# Patient Record
Sex: Male | Born: 1940 | Race: White | Hispanic: No | Marital: Married | Smoking: Former smoker
Health system: Southern US, Community
[De-identification: ages and names within clinical notes are randomized; demographics above are authoritative.]

## PROBLEM LIST (undated history)

## (undated) DIAGNOSIS — I639 Cerebral infarction, unspecified: Secondary | ICD-10-CM

## (undated) DIAGNOSIS — R972 Elevated prostate specific antigen [PSA]: Secondary | ICD-10-CM

## (undated) DIAGNOSIS — I714 Abdominal aortic aneurysm, without rupture: Secondary | ICD-10-CM

## (undated) DIAGNOSIS — I10 Essential (primary) hypertension: Secondary | ICD-10-CM

## (undated) DIAGNOSIS — I2510 Atherosclerotic heart disease of native coronary artery without angina pectoris: Secondary | ICD-10-CM

## (undated) DIAGNOSIS — K439 Ventral hernia without obstruction or gangrene: Secondary | ICD-10-CM

## (undated) DIAGNOSIS — I701 Atherosclerosis of renal artery: Secondary | ICD-10-CM

## (undated) DIAGNOSIS — F0150 Vascular dementia without behavioral disturbance: Secondary | ICD-10-CM

## (undated) DIAGNOSIS — I619 Nontraumatic intracerebral hemorrhage, unspecified: Secondary | ICD-10-CM

## (undated) DIAGNOSIS — E785 Hyperlipidemia, unspecified: Secondary | ICD-10-CM

## (undated) HISTORY — DX: Atherosclerotic heart disease of native coronary artery without angina pectoris: I25.10

## (undated) HISTORY — DX: Essential (primary) hypertension: I10

## (undated) HISTORY — DX: Elevated prostate specific antigen (PSA): R97.20

## (undated) HISTORY — DX: Cerebral infarction, unspecified: I63.9

## (undated) HISTORY — DX: Atherosclerosis of renal artery: I70.1

## (undated) HISTORY — PX: CARDIAC CATHETERIZATION: SHX172

## (undated) HISTORY — DX: Nontraumatic intracerebral hemorrhage, unspecified: I61.9

## (undated) HISTORY — DX: Ventral hernia without obstruction or gangrene: K43.9

## (undated) HISTORY — DX: Abdominal aortic aneurysm, without rupture: I71.4

## (undated) HISTORY — DX: Hyperlipidemia, unspecified: E78.5

---

## 1997-05-23 DIAGNOSIS — I619 Nontraumatic intracerebral hemorrhage, unspecified: Secondary | ICD-10-CM

## 1997-05-23 HISTORY — DX: Nontraumatic intracerebral hemorrhage, unspecified: I61.9

## 1997-10-14 ENCOUNTER — Encounter: Admission: RE | Admit: 1997-10-14 | Discharge: 1998-01-12 | Payer: Self-pay

## 1997-12-10 ENCOUNTER — Encounter: Admission: RE | Admit: 1997-12-10 | Discharge: 1998-03-10 | Payer: Self-pay

## 2007-05-24 DIAGNOSIS — I2510 Atherosclerotic heart disease of native coronary artery without angina pectoris: Secondary | ICD-10-CM

## 2007-05-24 HISTORY — DX: Atherosclerotic heart disease of native coronary artery without angina pectoris: I25.10

## 2007-10-31 ENCOUNTER — Ambulatory Visit: Payer: Self-pay | Admitting: Cardiovascular Disease

## 2007-10-31 ENCOUNTER — Inpatient Hospital Stay (HOSPITAL_COMMUNITY): Admission: EM | Admit: 2007-10-31 | Discharge: 2007-11-06 | Payer: Self-pay | Admitting: Emergency Medicine

## 2007-10-31 ENCOUNTER — Encounter (INDEPENDENT_AMBULATORY_CARE_PROVIDER_SITE_OTHER): Payer: Self-pay | Admitting: Emergency Medicine

## 2007-10-31 ENCOUNTER — Encounter: Payer: Self-pay | Admitting: Emergency Medicine

## 2007-11-01 ENCOUNTER — Ambulatory Visit: Payer: Self-pay | Admitting: Thoracic Surgery (Cardiothoracic Vascular Surgery)

## 2007-11-02 ENCOUNTER — Encounter: Payer: Self-pay | Admitting: Surgery

## 2007-11-02 HISTORY — PX: CORONARY ARTERY BYPASS GRAFT: SHX141

## 2007-11-27 ENCOUNTER — Ambulatory Visit: Payer: Self-pay | Admitting: Surgery

## 2007-11-27 ENCOUNTER — Encounter: Admission: RE | Admit: 2007-11-27 | Discharge: 2007-11-27 | Payer: Self-pay | Admitting: Surgery

## 2007-12-17 ENCOUNTER — Ambulatory Visit: Payer: Self-pay | Admitting: Cardiology

## 2008-02-07 ENCOUNTER — Ambulatory Visit: Payer: Self-pay | Admitting: *Deleted

## 2008-03-06 ENCOUNTER — Encounter: Admission: RE | Admit: 2008-03-06 | Discharge: 2008-03-06 | Payer: Self-pay | Admitting: *Deleted

## 2008-03-13 ENCOUNTER — Ambulatory Visit: Payer: Self-pay | Admitting: *Deleted

## 2008-03-26 ENCOUNTER — Ambulatory Visit: Payer: Self-pay | Admitting: *Deleted

## 2008-03-26 ENCOUNTER — Ambulatory Visit (HOSPITAL_COMMUNITY): Admission: RE | Admit: 2008-03-26 | Discharge: 2008-03-26 | Payer: Self-pay | Admitting: *Deleted

## 2008-04-10 ENCOUNTER — Ambulatory Visit: Payer: Self-pay | Admitting: *Deleted

## 2008-05-06 ENCOUNTER — Inpatient Hospital Stay (HOSPITAL_COMMUNITY): Admission: RE | Admit: 2008-05-06 | Discharge: 2008-05-11 | Payer: Self-pay | Admitting: *Deleted

## 2008-05-06 ENCOUNTER — Encounter (INDEPENDENT_AMBULATORY_CARE_PROVIDER_SITE_OTHER): Payer: Self-pay | Admitting: *Deleted

## 2008-05-06 ENCOUNTER — Ambulatory Visit: Payer: Self-pay | Admitting: *Deleted

## 2008-05-06 DIAGNOSIS — I714 Abdominal aortic aneurysm, without rupture, unspecified: Secondary | ICD-10-CM

## 2008-05-06 HISTORY — DX: Abdominal aortic aneurysm, without rupture: I71.4

## 2008-05-06 HISTORY — DX: Abdominal aortic aneurysm, without rupture, unspecified: I71.40

## 2008-05-21 ENCOUNTER — Ambulatory Visit: Payer: Self-pay | Admitting: *Deleted

## 2008-05-29 ENCOUNTER — Ambulatory Visit: Payer: Self-pay | Admitting: *Deleted

## 2008-07-22 ENCOUNTER — Ambulatory Visit: Payer: Self-pay | Admitting: Cardiology

## 2008-08-28 ENCOUNTER — Ambulatory Visit: Payer: Self-pay | Admitting: *Deleted

## 2009-02-12 ENCOUNTER — Encounter: Payer: Self-pay | Admitting: Cardiology

## 2009-02-12 LAB — CONVERTED CEMR LAB
ALT: 11 units/L (ref 0–53)
Albumin: 4.1 g/dL (ref 3.5–5.2)
Calcium: 9.2 mg/dL (ref 8.4–10.5)
Chloride: 105 meq/L (ref 96–112)
Cholesterol: 118 mg/dL (ref 0–200)
HCT: 45 % (ref 39.0–52.0)
Hemoglobin: 14.6 g/dL (ref 13.0–17.0)
LDL Cholesterol: 61 mg/dL (ref 0–99)
Lymphocytes Relative: 17 % (ref 12–46)
Lymphs Abs: 1.4 10*3/uL (ref 0.7–4.0)
MCV: 88.1 fL (ref 78.0–100.0)
Neutro Abs: 5.8 10*3/uL (ref 1.7–7.7)
Neutrophils Relative %: 69 % (ref 43–77)
Platelets: 188 10*3/uL (ref 150–400)
Potassium: 4.5 meq/L (ref 3.5–5.3)
RBC: 5.11 M/uL (ref 4.22–5.81)
RDW: 15.2 % (ref 11.5–15.5)
Sodium: 143 meq/L (ref 135–145)
Total Protein: 6.3 g/dL (ref 6.0–8.3)
VLDL: 22 mg/dL (ref 0–40)
WBC: 8.5 10*3/uL (ref 4.0–10.5)

## 2009-07-23 ENCOUNTER — Emergency Department (HOSPITAL_COMMUNITY): Admission: EM | Admit: 2009-07-23 | Discharge: 2009-07-23 | Payer: Self-pay | Admitting: Emergency Medicine

## 2009-08-31 ENCOUNTER — Ambulatory Visit: Payer: Self-pay | Admitting: Surgery

## 2009-09-07 ENCOUNTER — Ambulatory Visit: Payer: Self-pay | Admitting: Surgery

## 2009-10-06 ENCOUNTER — Encounter (INDEPENDENT_AMBULATORY_CARE_PROVIDER_SITE_OTHER): Payer: Self-pay | Admitting: *Deleted

## 2010-02-24 ENCOUNTER — Encounter (INDEPENDENT_AMBULATORY_CARE_PROVIDER_SITE_OTHER): Payer: Self-pay | Admitting: *Deleted

## 2010-06-22 NOTE — Letter (Signed)
Summary: Appointment - Reminder 2  [facility] [place]. [address] [place] [address]   Phone: [phone]  Fax: [phone]     Oct 06, 2009 MRN: [id number]   [facility] [address] [place], [place]  [address]   Dear Mr. Mcbrearty,  Our records indicate that it is time to schedule a follow-up appointment.  Dr.   Dietrich Pates       recommended that you follow up with [place] in     07/2009       . It is very important that we reach you to schedule this appointment. We look forward to participating in your health care needs. Please contact us at the number listed above at your earliest convenience to schedule your appointment.  If you are unable to make an appointment at this time, give [place] a call so we can update our records.     Sincerely,   Glass blower/designer

## 2010-06-22 NOTE — Letter (Signed)
Summary: Appointment - Reminder 2  [facility] [place]. [address], [place] [address]   Phone: [phone]  Fax: [phone]     February 24, 2010 MRN: [id number]   Henry Allen [address] [place], [place]  [address]   Dear [name],  Our records indicate that it is time to schedule a follow-up appointment.  Dr. Dietrich Pates         recommended that you follow up with [place] in    07/2009        . It is very important that we reach you to schedule this appointment. We look forward to participating in your health care needs. Please contact us at the number listed above at your earliest convenience to schedule your appointment.  If you are unable to make an appointment at this time, give [place] a call so we can update our records.     Sincerely,   Glass blower/designer

## 2010-08-15 LAB — URINALYSIS, ROUTINE W REFLEX MICROSCOPIC
Glucose, UA: NEGATIVE mg/dL
Hgb urine dipstick: NEGATIVE
Ketones, ur: NEGATIVE mg/dL
Leukocytes, UA: NEGATIVE
Nitrite: NEGATIVE
Protein, ur: 30 mg/dL — AB
Urobilinogen, UA: 0.2 mg/dL (ref 0.0–1.0)
pH: 6 (ref 5.0–8.0)

## 2010-08-15 LAB — URINE MICROSCOPIC-ADD ON

## 2010-08-23 ENCOUNTER — Other Ambulatory Visit: Payer: Self-pay

## 2010-10-05 NOTE — Assessment & Plan Note (Signed)
OFFICE VISIT   Henry Allen, Henry Allen  DOB:  [birth year]                                       03/13/2008  ZOXWR#:[id number]   The patient returned to the office today with a CTA of the abdomen.  This does verify a 7.5 cm infrarenal abdominal aortic aneurysm.  The  infrarenal neck, however, does have significant atheromatous change  along with an area of laminated thrombus.  There are two left renal  arteries, the superior most may have a significant stenosis.  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  The iliac arteries are dilated  bilaterally, measuring 2.2 cm in the common iliac arteries.   Also, incidental note of gallstones.   With these findings, I do not feel that the patient is an ideal  candidate for placement of an aortic stent graft.  I do think he should  undergo a formal arteriogram to further delineate the anatomy of his  renal arteries and outline the proximal extent of the aneurysm in more  detail.  He is agreeable to this and this will be scheduled for  03/26/2008 at [facility].   Balinda Quails, M.D.  Electronically Signed   PGH/MEDQ  D:  03/13/2008  T:  03/14/2008  Job:  [id number]   cc:   Bevelyn Buckles. Bensimhon, MD  Evelene Croon, M.D.  Madelin Rear. Sherwood Gambler, MD

## 2010-10-05 NOTE — Procedures (Signed)
DUPLEX ULTRASOUND OF ABDOMINAL AORTA   INDICATION:  Follow-up evaluation of known abdominal aortic aneurysm.   HISTORY:  Diabetes:  No.  Cardiac:  Coronary artery bypass graft x4 on 11/02/07.  Hypertension:  Yes.  Smoking:  Patient recently quit.  Connective Tissue Disorder:  Family History:  Yes.  Previous Surgery:  Recent coronary artery bypass graft.   DUPLEX EXAM:         AP (cm)                   TRANSVERSE (cm)  Proximal             3.4 Cm                    3.5 cm  Mid                  6.0 cm                    5.2 cm  Distal               5.1 cm                    4.8 cm  Right Iliac          1.3 cm                    1.38 cm  Left Iliac           1.6 cm                    2.0 cm   PREVIOUS:  Date:  AP:  TRANSVERSE:   IMPRESSION:  1. Large abdominal aortic aneurysm is identified.  2. Bilateral common iliac artery dilation is noted.   ___________________________________________  P. Liliane Bade, M.D.   MC/MEDQ  D:  02/07/2008  T:  02/07/2008  Job:  [id number]

## 2010-10-05 NOTE — Discharge Summary (Signed)
[name], [name]               ACCOUNT NO.:  [id number]   MEDICAL RECORD NO.:  [id number]          PATIENT TYPE:  INP   LOCATION:  2025                         FACILITY:  [facility]   PHYSICIAN:  Balinda Quails, M.D.    DATE OF BIRTH:  1940-11-06   DATE OF ADMISSION:  05/06/2008  DATE OF DISCHARGE:  05/11/2008                               DISCHARGE SUMMARY   FINAL DISCHARGE DIAGNOSES:  1. 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  2. Dyslipidemia.  3. Hypertension  4. History of cerebrovascular disease.  5. History of atrial fibrillation.   PROCEDURE PERFORMED:  Abdominal aortic aneurysm repair using a 20 x 10  bifurcated Hemashield graft by Dr. Madilyn Fireman on May 06, 2008.   COMPLICATIONS:  None.   CONDITION AT DISCHARGE:  Stable and improving.   DISCHARGE MEDICATIONS:  1. Lisinopril 20 mg [name]. b.i.d.  2. Hydrochlorothiazide 25 mg [name]. daily.  3. Aspirin 81 mg [name]. daily.  4. Simvastatin 40 mg [name]. daily.  5. Amlodipine 10 mg [name]. daily.  6. Metoprolol 25 mg [name]. b.i.d.  7. He is given a prescription for Darvocet-N 100 one [name]. q.4 h.      [name]  8. He is instructed not to resume his labetalol at this time as his      blood pressure remains in the 120 systolic.  This may need to be      resumed at a later date.   DISPOSITION:  He is being discharged home in stable condition with his  wounds healing well.  He is given careful instructions regarding the  care of his wounds.  He is given an appointment to see Dr. Madilyn Fireman in 2  weeks for followup ABIs and staple removal.   Brief identifying statement complete details, please refer the typed  history and physical.  Briefly, this very pleasant 70 year old gentleman  was referred to Dr. Madilyn Fireman with a 7-cm abdominal aortic aneurysm with  bilateral iliac aneurysms.  Dr. Madilyn Fireman evaluated him found him to be a  suitable candidate for repair.  He was  informed of the risks and  benefits of the procedure and after careful consideration, he elected to  proceed with surgery.   HOSPITAL COURSE:  Preoperative workup was completed as an outpatient.  He was brought in through same-day surgery and underwent the  aforementioned repair of his abdominal aortic aneurysm.  For complete  details, please refer the typed operative report.  The procedure was  without complications.  He was returned to the intensive care unit in  critical but hemodynamically stable condition.  Mechanical ventilation  was subsequently weaned.  He was extubated.  He was able to be  transferred to a bed on a surgical convalescent floor in the second  postoperative day.  His diet and activity level were advanced as tolerated.  On May 11, 2008, he was walking and improving with physical therapy.  His wounds  were healing well.  His diet had been advanced to his preoperative diet.  He was desirous of discharge and was discharged home in stable  condition.      Henry Arms, PA      Henry Allen, M.D.  Electronically Signed    KEL/MEDQ  D:  05/11/2008  T:  05/11/2008  Job:  [id number]

## 2010-10-05 NOTE — Assessment & Plan Note (Signed)
OFFICE VISIT   RAYLAND, HAMED  DOB:  1941-01-03                                       05/29/2008  ZOXWR#:[id number]   The patient underwent abdominal aortic aneurysm repair 05/06/2008 at  [facility].  He is continuing to recover adequately.  He has  very typical fatigue and early satiety.   Blood pressure 145/76, pulse 60 per minute.  Midline incision healing  well.  No evidence of hernia.  Intact femoral pulses bilaterally.   The patient reassured.  He is recovering well from his surgery.  We will  continue followup in 4-6 weeks.   Balinda Quails, M.D.  Electronically Signed   PGH/MEDQ  D:  05/29/2008  T:  05/30/2008  Job:  [id number]   cc:   Evelene Croon, M.D.  Bevelyn Buckles. Bensimhon, MD  Madelin Rear. Sherwood Gambler, MD

## 2010-10-05 NOTE — Op Note (Signed)
Henry Allen, Henry Allen               ACCOUNT NO.:  [id number]   MEDICAL RECORD NO.:  [id number]          PATIENT TYPE:  INP   LOCATION:  2301                         FACILITY:  [facility]   PHYSICIAN:  Balinda Quails, M.D.    DATE OF BIRTH:  04-08-1941   DATE OF PROCEDURE:  05/06/2008  DATE OF DISCHARGE:                               OPERATIVE REPORT   SURGEON:  Balinda Quails, MD   ASSISTANT:  Jerold Coombe, PA   ANESTHETIC:  General endotracheal.   ANESTHESIOLOGIST:  Kaylyn Layer. Michelle Piper, MD   PREOPERATIVE DIAGNOSES:  1. A 7-cm infrarenal abdominal aortic aneurysm.  2. Bilateral common iliac artery aneurysms.   POSTOPERATIVE DIAGNOSES:  1. A 7-cm infrarenal abdominal aortic aneurysm.  2. Bilateral common iliac artery aneurysms.   PROCEDURE:  Repair of infrarenal abdominal aortic aneurysm and bilateral  common iliac artery aneurysms with aortobi-iliac graft.   CLINICAL NOTE:  Henry Allen is a 70 year old gentleman with history of  coronary artery disease, status post coronary artery bypass carried out  this year.  At that time, he was noted to have a large abdominal aortic  aneurysm.  He recovered well from his coronary artery surgery.  Workup  for his aneurysm revealed this to be a 7-cm juxtarenal abdominal aortic  aneurysm.  Extensive atherosclerotic change noted at the neck.  The  patient was not felt to be candidate for stent placement.  Also, he has  a left renal artery stenosis and bilateral common iliac artery  enlargement.  He is brought to the operating room at this time for  elective repair.   Potential risks of the operative procedure were reviewed with the  patient.  These include but are not limited to MI, CVA, renal failure,  limb loss, infection, and death.   OPERATIVE PROCEDURE:  The patient was brought to the operating room in  stable hemodynamic condition.  Foley catheter, arterial line, and Swan-  Ganz catheter in place.  In the supine position, general  endotracheal  anesthesia was induced.  The abdomen and both legs were prepped and  draped in a sterile fashion.   A midline skin incision was made from xiphoid to pubis.  The  subcutaneous tissue was divided with electrocautery.  The dissection was  carried down through the linea alba.  The peritoneal cavity was entered  without difficulty.   Full laparotomy evaluation was carried out.  Liver, gallbladder, and  bile ducts were normal.  The stomach and duodenum were unremarkable.  Small bowel was normal.  Large bowel revealed diverticular changes in  the left colon, but no other abnormality.   The retroperitoneum verified a large juxtarenal abdominal aortic  aneurysm.  The common iliac arteries were enlarged bilaterally.   The small bowel was retracted to the right, the transverse colon was  brought superiorly.  The neck of the aneurysm was dissected out up to  the left renal vein.  There was inflammatory change and a fair amount of  oozing from the neck area.  There was marked inflammatory changes in the  wall of the aorta.  The left renal artery was quite stuck.  It was  identified, but quite sucked in with inflammatory change.  Therefore, a  left renal artery bypass was decided against despite the left renal  artery stenosis.   The neck was dissected out up to the origin of the renal arteries  bilaterally.  Adequately exposed for placement of a clamp.  Distal  dissection was then carried down along the aneurysm sac to the aortic  bifurcation.  The retroperitoneum was incised along the course of the  right common iliac artery.  This was dissected down to the origin of the  hypogastric and right external iliac arteries which were each encircled  with vessel loops.  The right common iliac artery revealed ectasia and  early aneurysmal change with moderate plaque.   The peritoneal reflection along the left colon was then incised and the  left retroperitoneal space was entered.  This  exposed a left common  iliac bifurcation.  Left ureter was reflected laterally.  The left  common iliac bifurcation was dissected out and the hypogastric and left  external iliac arteries were encircled with vessel loops.  This vessel  also revealed moderate plaque and early aneurysmal change.   The patient was administered 5000 units of heparin intravenously and 25  g of mannitol.   The infrarenal aorta was controlled with an aortic DeBakey clamp.  The  iliac vessels were controlled bilaterally with coarctation clamps.  A  longitudinal arteriotomy was made through the aneurysm sac.  A large  amount of laminated thrombus removed.  No significant backbleeding of  the lumbar vessels were present.  The aneurysm was opened up to the  proximal neck.  The neck was endarterectomized and atherosclerotic  debris was removed and washed out.  A 20 x 10 Hemashield bifurcated  graft was chosen.  The body of graft was anastomosed to the neck of the  aneurysm using running 3-0 Prolene suture in an end-to-end fashion.  At  the completion of the proximal, anastomosis was flushed.  A small amount  of bleeding present and required 1 single repair suture.  The graft was  then flushed and each end of the graft controlled with Fogarty clamp.   The left limb of the graft was tunneled behind the left mesocolon and  sigmoid mesocolon.  This was brought out to the left retroperitoneal  exposure.  The left common iliac artery was divided transversely just  proximal to its bifurcation.  The left limb of the graft was divided and  anastomosed end-to-end to left common iliac bifurcation using running 4-  0 Prolene suture.  Flushing was carried out.  Clamps were then removed.  Left leg was reperfused without difficulty.   The right limb of the graft was then brought down to the right common  iliac bifurcation.  The right common iliac artery was divided  transversely.  The right limb was divided and anastomosed  end-to-end to  the right common iliac bifurcation using running 4-0 Prolene suture.  At  the completion of the distal anastomosis, again flushing was carried out  and the right leg was reperfused.   The patient was administered 50 mg of protamine intravenously.  Adequate  hemostasis was obtained.  Sponges and instrument counts were correct.   The aneurysm sac was closed over the graft using running 2-0 Vicryl  suture.  The retroperitoneum was reapproximated with running 3-0 Vicryl  suture.  Sponges and instrument counts were correct.  The abdomen was  examined to assure there were no retained instruments or sponges.   Midline fascia was then closed with running #1 PDS suture.  Skin was  closed with staples.  Sterile dressing was applied.   No apparent intraoperative complications.  The patient was transferred  to the recovery room in stable condition.      Balinda Quails, M.D.  Electronically Signed     PGH/MEDQ  D:  05/06/2008  T:  05/06/2008  Job:  [id number]
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
## 2010-10-05 NOTE — Letter (Signed)
December 17, 2007    Madelin Rear. Sherwood Gambler, MD  [address]  [place], [place] [address]   RE:  Henry Allen, Henry Allen  MRN:  [id number]  /  DOB:  1940/08/25   Dear Peyton Najjar,   Mr. Schmader returns to the office following CABG surgery at [facility].  He presented with a subacute myocardial infarction and was  found to have pericarditis at the time of his operation.  He developed  atrial fibrillation postoperatively, but has done generally well.  He is  back to a fairly active lifestyle without cardiopulmonary symptoms.  He  has been seen in followup by Dr. Laneta Simmers, who indicated that no further  visits to his office would be necessary.  Hypertension is well  controlled.  Mr. Deeb continues to refrain from cigarette smoking.  His lipid profile has not been assessed since surgery, but was  relatively good preoperatively.  A substantial anemia was noted in the  postoperative interval.   Current medications include:  1. Lisinopril 40 mg daily.  2. Metoprolol 25 mg b.i.d.  3. Simvastatin 20 mg nightly.  4. Transdermal nicotine.   His discharge summary included aspirin 81 mg daily, but he is not taking  this.  He uses tramadol p.r.n.   PHYSICAL EXAMINATION:  GENERAL:  A pleasant proportionate gentleman in  no acute distress.  VITAL SIGNS:  The weight is 188, blood pressure 140/85, heart rate 70  and regular, and respirations 16.  NECK:  No jugular venous distention; no carotid bruits.  LUNGS:  Clear.  CARDIAC:  Normal first and second heart sounds; fourth heart sound  present.  THORAX:  Stable sternum without tenderness.  ABDOMEN:  Soft and nontender; no organomegaly.  EXTREMITIES:  No edema; well-healed incisions; distal pulses intact.   EKG:  Normal sinus rhythm; left atrial abnormality; LVH with  repolarization abnormality; delayed R-wave progression.   IMPRESSION:  Mr. Simien is doing generally well.  Additional lipid  lowering would be beneficial.  His dose of simvastatin will be  increased  to 40 mg daily.  He will start on aspirin as was intended.  He is  congratulated on not returning to cigarette smoking.  I will reassess  this nice gentleman in 6 months.    Sincerely,      Gerrit Friends. Dietrich Pates, MD, [facility]  Electronically Signed    RMR/MedQ  DD: 12/17/2007  DT: 12/18/2007  Job #: [id number]

## 2010-10-05 NOTE — Letter (Signed)
July 22, 2008    Henry Rear. Sherwood Gambler, MD  [address]  [place], [place] [address]   RE:  Henry Allen, Henry Allen  MRN:  [id number]  /  DOB:  03-25-1941   Dear Henry Allen,   Henry Allen returns to the office for continued assessment and treatment  of coronary artery disease, peripheral vascular disease, and multiple  cardiovascular risk factors.  Since his last visit in mid 2009, he has  undergone repair of his abdominal aortic aneurysm.  This occurred in  December of last year and was an open procedure.  He had a difficult to  recovery, but now feels that he is about back to baseline.  Blood  pressure control has been good.  Control of hyperlipidemia has been  good.  He continues to refrain from cigarette smoking for the most part.  He uses a nicotine replacement device.  When he has no refills for that,  he does smoke an occasional cigarette.  He has no chest discomfort nor  dyspnea.  Blood pressure control has been good.   CURRENT MEDICATIONS:  1. Lisinopril 40 mg b.i.d.  2. Metoprolol 25 mg b.i.d.  3. Simvastatin 40 mg daily.  4. Aspirin 81 mg daily.  5. Labetalol 200 mg b.i.d.  6. HCTZ 12.5 mg daily.  7. Amlodipine 10 mg daily.   PHYSICAL EXAMINATION:  GENERAL:  Pleasant gentleman in no acute  distress.  VITAL SIGNS:  The weight is 200, 12 pounds more than last year.  Blood  pressure is 120/70, heart rate is 65 and regular, respirations 12 and  unlabored.  NECK:  No jugular venous distention; normal carotid upstrokes without  bruits.  LUNGS:  Clear.  CARDIAC:  Normal first and second heart sounds; minimal systolic  ejection murmur.  ABDOMEN:  Soft and nontender; no bruits.  EXTREMITIES:  Distal pulses are intact; no edema.   Most recent lipid profile is from last August and was excellent with a  total cholesterol of 141, triglycerides of 65, HDL of 59, and LDL of 69.  Chemistry profile and CBC were normal at that time.   IMPRESSION:  Henry Allen is doing very well with his current  medical  therapy.  He has some financial issues regarding his medical care.  He  is exploring resources to help him with his low-cost generic drugs and  with his other medical expenses.  I will check a chemistry profile, CBC,  and lipid profile in 6 months and plan to see this nice gentleman again  in 1 year.    Sincerely,      Gerrit Friends. Dietrich Pates, MD, [facility]  Electronically Signed    RMR/MedQ  DD: 07/22/2008  DT: 07/23/2008  Job #: [id number]

## 2010-10-05 NOTE — Procedures (Signed)
RENAL ARTERY DUPLEX EVALUATION   INDICATION:  50% left renal artery stenosis by catheterization on  11/01/07.  BUN 30, creatinine 1.21.  Known abdominal aortic aneurysm and  enlarged right kidney by CT, performed 10/31/07.   HISTORY:  Diabetes:  No.  Cardiac:  Coronary artery bypass graft x4 on 11/02/07.  Hypertension:  Yes.  Smoking:  Recently quit.   RENAL ARTERY DUPLEX FINDINGS:  Aorta-Proximal:  106 cm/s  Aorta-Mid:  101 cm/s  Aorta-Distal:  44 cm/s  Celiac Artery Origin:  SMA Origin:  227 cm/s                                    RIGHT               LEFT  Renal Artery Origin:             136/21 cm/s         Not imaged  Renal Artery Proximal:           70/16 cm/s          222/43 cm/s  Renal Artery Mid:                72/14 cm/s          192/29 cm/s  Renal Artery Distal:             94/15 cm/s          58/10 cm/s  Hilar Acceleration Time (AT):  Renal-Aortic Ratio (RAR):        1.28                2.01  Kidney Size:                     13.2 cm             10.6 cm  End Diastolic Ratio (EDR):       0.26 - 0.34         0.32 - 0.33  Resistive Index (RI):            0.78                0.81   IMPRESSION:  1. The right kidney is larger than the left kidney.  2. Resistive indices suggest parenchymal disease bilaterally.  3. Left renal artery velocities are elevated; however, the renal-to-      aortic ratio is invalid secondary to known abdominal aortic      aneurysm.   ___________________________________________  P. Liliane Bade, M.D.   MC/MEDQ  D:  02/07/2008  T:  02/07/2008  Job:  [id number]

## 2010-10-05 NOTE — Procedures (Signed)
RENAL ARTERY DUPLEX EVALUATION   INDICATION:  Follow up known left renal artery disease.   HISTORY:  Diabetes:  No.  Cardiac:  CABG x4 on 11/02/07.  Hypertension:  Yes.  Smoking:  Yes.   RENAL ARTERY DUPLEX FINDINGS:  Aorta-Proximal:  38 cm/s  Aorta-Mid:  40 cm/s  Aorta-Distal:  14 cm/s  Celiac Artery Origin:  Not obtained  SMA Origin:  82 cm/s                                    RIGHT               LEFT  Renal Artery Origin:             Not imaged          220/44 cm/s  Renal Artery Proximal:           43/10 cm/s          249/73 cm/s  Renal Artery Mid:                99/13 cm/s          288/64 cm/s  Renal Artery Distal:             102/12 cm/s         115/18 cm/s  Hilar Acceleration Time (AT):    40/10 m/s2          38/13 m/s2  Renal-Aortic Ratio (RAR):        Not accurate due to AAA repair         Not accurate due to AAA  repair  Kidney Size:                     14.9 cm             12.8 cm  End Diastolic Ratio (EDR):  Resistive Index (RI):            0.72                0.65   IMPRESSION:  1. Right renal artery appears to have normal velocities.  2. Left renal artery possibly suggests >60% stenosis; however, the      renal-aortic ratio is invalid secondary to abdominal aortic      aneurysm repair.  3. The left kidney is slightly smaller than the right.        ___________________________________________  V. Charlena Cross, MD   NT/MEDQ  D:  08/31/2009  T:  09/01/2009  Job:  [id number]

## 2010-10-05 NOTE — H&P (Signed)
Henry Allen, Henry Allen               ACCOUNT NO.:  [id number]   MEDICAL RECORD NO.:  [id number]          PATIENT TYPE:  INP   LOCATION:  2899                         FACILITY:  [facility]   PHYSICIAN:  Balinda Quails, M.D.    DATE OF BIRTH:  [birth year]   DATE OF ADMISSION:  05/06/2008  DATE OF DISCHARGE:                              HISTORY & PHYSICAL   PRIMARY CARE PHYSICIAN:  Henry Rear. Sherwood Gambler, MD   CARDIOLOGIST:  Bevelyn Buckles. Bensimhon, MD   THORACIC SURGEON:  Evelene Croon, MD   ADMISSION DIAGNOSIS:  Abdominal aortic aneurysm.   HISTORY:  Mr. Henry Allen is a 70 year old gentleman who underwent  coronary artery bypass in June of this year by Dr. Laneta Simmers.  He has a  history of poorly-controlled hypertension and was referred for  management of the abdominal aortic aneurysm.  Ultrasound revealed his  aneurysm to be 6 cm in diameter.  Common iliac arteries noted to be  dilated.  Renal duplex revealed a small left kidney with elevated  velocities consistent with possible left renal artery stenosis.   He has undergone CT angiogram and arteriography.  This does verify  severe stenosis in the left renal artery, bilateral iliac ectasia,  atherosclerotic infrarenal aortic neck, and laminated thrombus present.   The patient scheduled at this time to undergo abdominal aortic aneurysm  repair.   PAST MEDICAL HISTORY:  1. Coronary artery disease, status post coronary artery bypass.  2. Atrial fibrillation.  3. Hypertension.  4. CVA.  5. Hyperlipidemia.   MEDICATIONS:  1. Lisinopril 20 mg daily.  2. Hydrochlorothiazide 25 mg daily.  3. Aspirin 81 mg daily.  4. Simvastatin 40 mg daily.  5. Amlodipine 10 mg daily.  6. Metoprolol 25 mg b.i.d.  7. Labetalol 200 mg b.i.d.   ALLERGIES:  None known.   FAMILY HISTORY:  Mother had a history of abdominal aortic aneurysm.  Father had coronary artery bypass and hypertension.   SOCIAL HISTORY:  The patient is married and lives in [place].  He  has  3 children who are grown.  He is retired.  Discontinued tobacco use in  June of this year.  Smoked up to 120-pack a year.  No regular alcohol  use.   REVIEW OF SYSTEMS:  The patient denies any recent chest pain or  shortness of breath.  No cough or sputum production.  No recent weight  loss.  No change in bowel habits.   PHYSICAL EXAMINATION:  GENERAL:  A 70 year old male alert and oriented.  No acute distress.  VITAL SIGNS:  BP 150/60, pulse 54 per minute, respirations 16 per  minute.  HEENT:  Mouth and throat are clear.  Normocephalic.  NECK:  Supple.  No thyromegaly or adenopathy.  CARDIOVASCULAR:  Normal heart sounds.  No murmurs.  No gallops or rubs.  Regular rate and rhythm.  No carotid bruits.  CHEST:  Equal air entry bilaterally.  No rales or rhonchi.  ABDOMEN:  Soft, nontender.  AAA palpable.  No organomegaly or other  masses felt.  Normal bowel sounds.  EXTREMITIES:  Femoral, popliteal, posterior tibial, and  dorsalis pedis  pulses are 2+ bilaterally.  No ankle edema.  NEUROLOGIC:  Cranial nerves intact.  Strength equal bilaterally.  SKIN:  Normal.   IMPRESSION:  1. A 6-cm infrarenal abdominal aortic aneurysm with bilateral common      iliac artery dilatation.  2. Left renal artery stenosis.  3. Hypertension.  4. Coronary artery disease.  5. Hyperlipidemia.   ADMISSION PLAN:  The patient admitted electively to [facility]  for AAA repair and possible left renal artery bypass.      Balinda Quails, M.D.  Electronically Signed     PGH/MEDQ  D:  05/06/2008  T:  05/06/2008  Job:  [id number]   cc:   Henry Rear. Sherwood Gambler, MD  Bevelyn Buckles. Bensimhon, MD  Evelene Croon, M.D.

## 2010-10-05 NOTE — Assessment & Plan Note (Signed)
OFFICE VISIT   Henry Allen, Henry Allen  DOB:  1940-10-14                                       05/21/2008  QIONG#:[id number]   The patient presented to the office today for removal of his staples.  He recently underwent repair of abdominal aortic aneurysm by Dr. Madilyn Fireman.  All the staples were removed today.  However, he does have a moderate  amount of erythema in the lower portion of the abdominal incision.  This  extends approximately a centimeter and a half away from the skin  incision itself.  There is no obvious drainage.  There is no hernia  defect.  He has 2+ femoral pulses.  The patient states he has taken  showers and washed the incision infrequently since his discharge from  the hospital.  I encouraged him to watch this daily with soap and water  and take at shower at least once daily.  I also started him on Keflex  today, a 10-day prescription was given.  He will follow up with Dr.  Madilyn Fireman at his regularly scheduled postop visit.  He will return sooner if  he develops any drainage, fevers or deterioration of his abdominal  wound.   Janetta Hora. Fields, MD  Electronically Signed   CEF/MEDQ  D:  05/21/2008  T:  05/22/2008  Job:  [id number]

## 2010-10-05 NOTE — Assessment & Plan Note (Signed)
OFFICE VISIT   Henry Allen, Henry Allen  DOB:  1940-06-24                                       09/07/2009  ZOXWR#:[id number]   The patient is a 70 year old gentleman, a former patient of Dr. Madilyn Fireman.  He is status post open repair of a 7-cm infrarenal abdominal aortic  aneurysm and bilateral common iliac aneurysms using a 20 x 10 bifurcated  graft.  This was done on May 06, 2008.  The patient has been doing  well since that time.  He does complain of abdominal wall hernias, which  he does not like.  He is not having any symptoms of obstruction or  incarceration/strangulation.  He has recently started smoking  approximately 2 packs per week.  He had quit for some time.   The patient continues to suffer from hypertension and  hypercholesterolemia, both of which are medically treated.  He remains  on his cardiac medications including aspirin.  He is not having any  chest pains.  He is status post CABG.   REVIEW OF SYSTEMS:  GENERAL:  Negative weight gain,negative for weight  loss.  CARDIAC:  Negative for chest pain.  PULMONARY:  Negative for shortness of breath.   All other review of systems is negative as documented in the encounter  form.   SOCIAL HISTORY:  He is married with 3 children, retired.  He has  recently started back smoking.  He now smokes 2 packs per week.  He had  quit in June 2009.  Does not drink alcohol.   PHYSICAL EXAMINATION:  Heart rate 55, blood pressure 124/68, O2  saturation 99%.  Generally, he is well-appearing, in no distress.  HEENT:  Within normal limits.  Lungs are clear bilaterally.  Cardiovascular is regular rate rhythm.  Abdomen is soft.  He does have 2  large abdominal wall ventral hernias, which are easily reducible.  Neurologic:  Cranial nerves II-XII are grossly intact.  Skin is without  rash.   DIAGNOSTIC STUDIES:  Renal artery duplex was performed today that shows  a possible greater than 50% stenosis.  However, the  renal/aorta ratio is  invalid secondary to his abdominal graft.  The left kidney is slightly  smaller than the right at 12.8 cm, the right is 14.9.  Resistive index  on the left is 0.65.  This is down from his previous resistive index in  September 2009, where it was 0.81.   ASSESSMENT/PLAN:  1. Abdominal aortic aneurysm:  The patient is stable status post      repair.  He has palpable posterior tibial pulses.  2. Ventral hernia:  I am making a referral with general surgery to      discuss the possibility of repair.  3. Renal artery stenosis:  We will continue to observe this for now.      His blood pressure is relatively well-controlled  I do not think      that he would receive any benefit from further evaluation at this      time.  I will repeat his renal ultrasound in 1 year.  4. History of stroke:  We will repeat his carotid ultrasound in 1      year.  I will see the patient back in 1 year please.     Jorge Ny, MD  Electronically Signed   VWB/MEDQ  D:  09/07/2009  T:  09/08/2009  Job:  [id number]   cc:   Evelene Croon, M.D.  Bevelyn Buckles. Bensimhon, MD  Madelin Rear. Sherwood Gambler, MD

## 2010-10-05 NOTE — Assessment & Plan Note (Signed)
OFFICE VISIT   Henry, Allen  DOB:  1940-12-16                                        November 27, 2007  CHART #:  [id number]   HISTORY:  The patient returned to my office today for followup status  post coronary artery bypass graft surgery x4 on 11/02/2007.  He had  presented with a 2-week history of stuttering chest pain and sustained  an out of hospital myocardial infarction.  He was noted to have markedly  inflamed and thickened pericardium from acute pericarditis at the time  of surgery and I removed as much of this as possible.  He was treated  with a steroid taper postoperatively for acute pericarditis.  He also  had postoperative atrial fibrillation and was converted with amiodarone  and sent home on oral dose.  Since discharge, he has been feeling  relatively well and is walking daily without chest pain or shortness of  breath.  He has no complaints today.  He has not seen Cardiology in  follow up yet.   PHYSICAL EXAMINATION:  VITAL SIGNS:  His blood pressure is 136/81, his  pulse is 92 and regular.  Respiratory rate is 20 and unlabored.  Oxygen  saturation on room air is 96%.  GENERAL:  He looks well.  CARDIAC:  Regular rate and rhythm with normal heart sounds.  LUNG:  Clear.  CHEST:  The chest incision is healing well and the sternum is stable.  EXTREMITIES:  His right leg vein harvest incisions are intact.  There  was a small subcutaneous hematoma or seroma just above the knee  incision, but no sign of infection.  There is moderate edema in the  right lower leg from the knee down.  There is no edema in the left leg.   IMAGING STUDIES:  Followup chest x-ray today shows left basilar  atelectasis.   MEDICATIONS:  1. Lopressor 25 mg b.i.d.  2. Amiodarone 200 mg b.i.d.  3. Zocor 20 mg daily.  4. Lisinopril 20 mg daily.  5. Iron 150 mg daily.  6. Ultram p.r.n. for pain.  I told him that he could discontinue the      amiodarone after his current  prescription is completed.   IMPRESSION:  Overall, the patient is making a good recovery following  surgery.  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  He will follow up with Dr. Gala Romney  in [place] on 12/17/2007.  He is also going to follow up with his  primary physician, Dr. Sherwood Gambler.  He will return to see me if he develops  any problems with his incisions.   Evelene Croon, M.D.  Electronically Signed   BB/MEDQ  D:  11/27/2007  T:  11/27/2007  Job:  [id number]   cc:   Bevelyn Buckles. Bensimhon, MD  Madelin Rear. Sherwood Gambler, MD

## 2010-10-05 NOTE — Assessment & Plan Note (Signed)
OFFICE VISIT   ANTOINE, VANDERMEULEN  DOB:  07/23/1940                                       08/28/2008  ZOXWR#:[id number]   The patient underwent abdominal aortic aneurysm repair along with  bilateral common iliac aneurysm repair carried out May 06, 2008, at  [facility].  He has continued to recover well since that time.  His symptoms of fatigue and early satiety have now subsided.  He has no  abdominal pain.  No major complaints.   His blood pressure is 128/69, pulse is 60 per minute.  His abdomen is  soft and nontender.  Midline incision well-healed.  No evidence of  hernia.  Femoral pulses 2+ bilaterally.   CURRENT MEDICATIONS:  1. Lisinopril 20 mg 2 times daily.  2. Hydrochlorothiazide 25 mg daily.  3. Aspirin 81 mg daily.  4. Simvastatin 40 mg daily.  5. Amlodipine 10 mg daily.  6. Metoprolol 25 mg 2 times daily.   The patient recovered well from his aortic aneurysm surgery.  He does  have a known left renal artery stenosis.  I will plan to have him return  to the office for a renal duplex in 1 year.   Balinda Quails, M.D.  Electronically Signed   PGH/MEDQ  D:  08/28/2008  T:  08/29/2008  Job:  [id number]   cc:   Evelene Croon, M.D.  Bevelyn Buckles. Bensimhon, MD  Madelin Rear. Sherwood Gambler, MD

## 2010-10-05 NOTE — Op Note (Signed)
NAMEBEVERLEY, SHERRARD               ACCOUNT NO.:  [id number]   MEDICAL RECORD NO.:  [id number]          PATIENT TYPE:  INP   LOCATION:  2301                         FACILITY:  [facility]   PHYSICIAN:  Evelene Croon, M.D.     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   POSTOPERATIVE DIAGNOSIS:  Severe three-vessel coronary disease status  post inferior myocardial infarction.   OPERATIVE PROCEDURE:  Median sternotomy, extracorporeal circulation,  coronary bypass graft surgery x4 using a left internal mammary artery  graft to the left anterior descending coronary, with a saphenous vein  graft to the diagonal branch of the left anterior descending, saphenous  vein graft to the intermediate coronary artery, and a saphenous vein  graft to the acute marginal branch of the right coronary.  Endoscopic  vein harvesting from the right leg.   ATTENDING SURGEON:  Evelene Croon, MD   ASSISTANTS:  1. Salvatore Decent. Cornelius Moras, MD  2. Doree Fudge, PA   ANESTHESIA:  General endotracheal.   CLINICAL HISTORY:  This patient is a 70 year old gentleman who presented  with a 2-week history of stuttering chest pain and sustaining out of  hospital a myocardial infarction.  Cardiac catheterization showed severe  three-vessel coronary disease.  The patient had a high-grade stenosis in  a small-to-moderate sized first diagonal branch.  The LAD was occluded  beyond this with filling by collaterals from the right coronary artery.  The intermediate vessel was a large vessel with a high-grade proximal  stenosis.  The left circumflex gave off a long, thin, but  small  marginal branch and then also terminated as a small distal branch.  The  right coronary artery was also severely diseased with a high-grade  proximal stenosis.   There was a large acute marginal or hot posterior  descending branch.  There was a tiny branch where the posterior  descending would be expected.  There is also a moderate size  posterolateral branch.  Left ventricular function was mildly reduced  with inferior posterior akinesis.  After review of the angiogram and  examination of the patient, it was felt that coronary bypass graft  surgery is best treatment to prevent further ischemia and infarction.  I  discussed the operative procedure with the patient including  alternatives, benefits, and risks including, but not limited to  bleeding, blood transfusion, infection, stroke, myocardial infarction,  graft failure, and death.  He understood and agreed to proceed.   OPERATIVE PROCEDURE:  The patient was taken to the operating room, and  placed on table in supine position.  After induction of general  endotracheal anesthesia, a Foley catheter was placed in bladder using  sterile technique.  Then the chest, abdomen and both lower extremities  were prepped and draped in usual sterile manner.  A transesophageal  echocardiogram was performed by Anesthesiology.  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  There is no  mitral  regurgitation.  There is no aortic stenosis.  There was mild-to-  moderate aortic insufficiency, which was central.  The leaflets moved  well and appeared to coapt fairly well but there was small area of  central regurgitation.  This was noted on preoperative echo.  After  reviewing this, I felt that this was not bad enough to warrant aortic  valve replacement.  The ascending aorta was a fairly normal size.   Then the chest was opened through a median sternotomy incision.  Examination of pericardium showed that there was marked acute  inflammation and thickening of the parietal pericardium.  There appeared  to be acute pericarditis.   Then attention was turned to the left internal  mammary, which was  harvested as a pedicle graft.  This is a medium caliber vessel with  excellent blood flow through it.  At the same time, a segment of greater  saphenous vein was harvested from the right leg using endoscopic vein  harvest technique and this vein was a medium size and good quality.   Then, attention was returned to the pericardium.  It was opened in the  midline.  There was severe acute pericarditis with marked thickening of  the parietal and visceral pericardium.  This was completely encasing the  heart with obliteration of pericardial space.  The the pericardium was  very vascular and inflamed.  The heart was dissected from the  pericardium to expose the right atrium and ascending aorta.  There was a  very thick layer of inflamed visceral pericardium over the right atrium  and this was carefully removed.  Then, the patient was heparinized when  an adequate activated clotting time was achieved.  The distal ascending  aorta was cannulated using a 20-French aortic cannula for arterial  inflow.  Venous outflow was achieved using a two-stage venous cannula  for the right atrial appendage.  An antegrade cardioplegia and vent  cannula was inserted in aortic root.  A retrograde cardioplegic cannula  was inserted through the right atrium and the coronary sinus.   The patient was placed on cardiopulmonary bypass.  The remainder of the  heart was dissected from the pericardium.  This was very tedious and  took considerable amount of time due to the acute inflammation with very  dense adhesions.  The surface of the heart was so inflamed, it was  difficult to dissect out the coronary arteries.  This took a  considerable amount of time.  I was able to find the acute marginal  branch which was a large vessel that was graftable.  There was a tiny  posterior descending branch lying adjacent to a large posterior  descending vein which was not graftable.  The posterolateral branch of   the right coronary artery was not visible.  The inflammation in this  inferior wall was very severe and it was markedly thickened.  It was not  possible to find this vessel.  The intermediate vessel was a large  graftable vessel.  The obtuse marginal was fairly small and diffusely  diseased and not felt to be graftable especially with the degree of  inflammation.  The distal left circumflex was not graftable.  The  diagonal branch was small, but graftable.  The LAD was diffusely  diseased, but suitable for grafting.   Then the aorta was crossclamped and 500 mL antegrade cardioplegia was  administered in the aortic root with quick arrest of the heart.  This  was followed by 500 mL of  cold blood retrograde cardioplegia.  Systemic  hypothermia to 20 degrees centigrade and topical hypothermic iced saline  was used.  Temperature probe placed in septum insulating pad in the  pericardium.   The first distal anastomosis was then performed to the intermediate  coronary artery.  The internal diameter was about 2 mm.  Conduit used  was a segment of greater saphenous vein and the anastomosis performed in  a end-to-side manner using 10-0 and 7-0 Prolene suture.  Flow was noted  through the graft was excellent.   The second distal anastomosis was performed to diagonal branch.  The  internal diameter was about 1.5-1.6 mm.  The conduit used was a second  segment of greater saphenous vein.  The anastomosis performed in an end-  to-side manner using continuous 7-0 Prolene suture.  Flow was noted  through the graft was excellent.  Then another dose of cardioplegia was  given in a retrograde manner as well as down the grafts.   The third distal anastomosis was performed to the acute marginal branch.  The internal diameter was about 1.6 mm.  Conduit used was a third  segment of greater saphenous vein. The anastomosis performed in an end-  to-side manner using continuous 7-0 Prolene suture.  Flow was  noted  through the graft was excellent.   The fourth distal anastomosis was performed to the distal LAD.  The  internal diameter was about 1.75 mm.  Conduit used was a left internal  mammary graft and this was brought through an opening in the left  pericardium anterior to the phrenic nerve.  This was anastomosed to the  LAD in an end-to-side manner using continuous 8-0 Prolene suture.  The  pedicle was sutured to the epicardium with 6-0 Prolene sutures.  Then,  the patient was rewarmed to 37 degrees centigrade.  An additional dose  of cardioplegia was given.  Then the 3 proximal vein graft anastomoses  were performed in the aortic root in an end-to-side manner using  continuous 6-0 Prolene suture. Then the clamp was removed from mammary  pedicle.  There is rapid warming of the ventricle septum and return of  spontaneous ventricular fibrillation.  The crossclamp was removed at  time 133 minutes.  The patient was spontaneously converted to sinus  rhythm.  The proximal and distal anastomoses appeared hemostatic while  the graft was satisfactory. Graft markers placed around the proximal  anastomoses.  Two temporary right ventricular and atrial pacing wires  placed above the skin.   When the patient rewarmed to 37 degrees centigrade, he was weaned from  cardiopulmonary bypass on low-dose dopamine.  Total bypass time was 187  minutes.  There initially appeared to be some right ventricular  dysfunction, but this improved with time.  Transesophageal  echocardiogram showed no change in the aortic insufficiency which I  chose as mild-to-moderate.  Left ventricular function appeared well-  preserved.  There is no mitral regurgitation.  Cardiac output was 7  liters minute.  Then protamine was given and the venous and aortic  cannulae were removed without difficulty.  After protamine was given,  the patient was still not clotting and was diffusely bleeding from all  the wall surface inside the  chest.  The patient was given 1 unit of  platelets and 2 units of FFP, which still did not correct the  coagulopathy and the patient was still not clotting and bleeding  profusely.  I could not find any specific bleeding site that could  be  cauterized.  All the pericardium was oozing blood.  At this point, I  decided to give the patient Nova 7, which was ordered from the pharmacy  and given over 3-5 minutes.  This resulted in marked improved  coagulation.  The patient was also given some cryoprecipitate.  With  this hemostasis was achieved.  Three chest tubes were placed, 2 in  posterior pericardium, 1 in left pleural space, and 1 in the anterior  mediastinum.  The pericardium was not reapproximated over the heart and  was cut away anteriorly.  The sternum was closed with #6 stainless steel  wires.  The fascia was closed with continuous #1 Vicryl suture.  Subcutaneous tissue was closed with continuous 2-0 Vicryl and the skin  with 3-0 Vicryl subcuticular closure.  Lower extremity vein harvest site  was closed in layers in similar manner.  The sponge, needle, and  instrument counts were correct.  Dry sterile dressings were applied over  the incisions, around the chest tubes which were Pleur-Evac suction.  The patient remained hemodynamically stable, was transported to the  SICU, in guarded, but stable condition.      Evelene Croon, M.D.  Electronically Signed     BB/MEDQ  D:  11/03/2007  T:  11/03/2007  Job:  [id number]   cc:   [facility]  Cardiology

## 2010-10-05 NOTE — Consult Note (Signed)
VASCULAR SURGERY CONSULTATION   Henry Allen, Henry Allen  DOB:  17-Jul-1940                                       02/07/2008  ZOXWR#:[id number]   REFERRING PHYSICIAN:  Madelin Rear. Sherwood Gambler, MD.   CARDIOLOGIST:  Bevelyn Buckles. Bensimhon, MD.   THORACIC SURGEON:  Evelene Croon, MD.   REFERRAL DIAGNOSIS:  Abdominal aortic aneurysm.   HISTORY:  The patient is a 70 year old gentleman who underwent coronary  artery bypass on June 12 of this year by Dr. Laneta Simmers.  He has a history  of poorly controlled hypertension and was referred for abdominal  ultrasound and renal duplex scan today.  Results of this revealed a 6 cm  abdominal aortic aneurysm, the iliac arteries are noted to be dilated  bilaterally.  Renal duplex scan also reveals a small left kidney with  elevated velocities consistent with possible left renal artery stenosis.   The patient has been free of abdominal pain or back pain.   Risk factors for aneurysm disease include coronary artery disease, male  sex, tobacco abuse, hypertension and positive family history.   PAST MEDICAL HISTORY:  1. Coronary disease status post coronary artery bypass.  2. Atrial fibrillation.  3. Hypertension.  4. CVA.  5. Hyperlipidemia.   MEDICATIONS:  1. Simvastatin 40 mg q.h.s..  2. Labetalol 200 mg b.i.Allen.  3. Amlodipine 10 mg daily.  4. Metoprolol 25 mg b.i.Allen.  5. Aspirin 81 mg daily.  6. Lisinopril 20 mg t.i.Allen.  7. Hydrochlorothiazide 25 mg daily.   ALLERGIES:  None known.   FAMILY HISTORY:  Mother had a history of abdominal aortic aneurysm.  Father had coronary artery bypass and hypertension.   SOCIAL HISTORY:  The patient is married, lives in [place].  He has  three children who are grown.  He is retired.  Discontinued tobacco use  in June of this year, having a smoking history of a 120 pack years.  No  regular alcohol use.   REVIEW OF SYSTEMS:  Refer to patient encounter form.  This is negative  for recent changes.  He did  have some visual disturbance in his eye  prior to his heart surgery but this has not recurred.   PHYSICAL EXAMINATION:  General:  A 70 year old male alert and oriented.  No acute distress.  Vital signs:  BP is 157/66, pulse 54 per minute,  respirations are 16 per minute.  HEENT:  Mouth and throat are clear.  Normocephalic.  Neck:  Supple.  No thyromegaly or adenopathy.  Cardiac:  Normal heart sounds.  No murmurs.  No gallops or rubs.  Regular rate and  rhythm.  No carotid bruits.  Chest:  Equal air entry bilaterally.  No  rales or rhonchi.  Abdomen:  Soft, nontender.  AAA palpable.  No  organomegaly or other masses felt.  Normal bowel sounds.  Extremities:  2+ femoral, popliteal, posterior tibial and dorsalis pedis pulses.  No  ankle edema.  Neurological:  Cranial nerves intact.  Strength equal  bilaterally.  Intact reflexes.  Skin:  No rash or ulceration.   IMPRESSION:  1. A 6 cm infrarenal abdominal aortic aneurysm with bilateral common      iliac artery dilatation.  2. Possible left renal artery stenosis.  3. Hypertension.  4. Coronary artery disease.  5. Hyperlipidemia.   RECOMMENDATIONS:  The patient will be scheduled to undergo  CT angiogram  of the abdomen and pelvis to further evaluate left renal artery stenosis  and architecture of aortic aneurysm disease for potential stent  placement.   Chronic medical conditions including coronary artery disease,  hypertension and hyperlipidemia well-controlled.   Balinda Quails, M.Allen.  Electronically Signed  PGH/MEDQ  Allen:  02/07/2008  T:  02/08/2008  Job:  [id number]   cc:   Bevelyn Buckles. Bensimhon, MD  Evelene Croon, M.Allen.  Madelin Rear. Sherwood Gambler, MD

## 2010-10-05 NOTE — Assessment & Plan Note (Signed)
OFFICE VISIT   Henry Allen, Henry Allen  DOB:  Jan 11, 1941                                       04/10/2008  ZOXWR#:[id number]   The patient has undergone an arteriogram to further delineate his aortic  aneurysm anatomy.  This does verify a wide origin to the right renal  artery.  A left renal artery stenosis, irregular infrarenal aortic neck  without atherosclerotic disease and laminated thrombus.  An infrarenal  AAA and ectasia of the common iliac arteries bilaterally.   These findings are not particularly favorable for placement of an aortic  stent graft.  I think he should undergo open operative repair.  This is  scheduled for May 05, 2008, at [facility].   Potential risks of surgery including MI, CVA, renal failure, limb loss  or death have been reviewed with the patient.  Planned operative  procedure is open AAA repair with left renal artery bypass.   Balinda Quails, M.D.  Electronically Signed   PGH/MEDQ  D:  04/10/2008  T:  04/11/2008  Job:  [id number]   cc:   Evelene Croon, M.D.  Bevelyn Buckles. Bensimhon, MD  Madelin Rear. Sherwood Gambler, MD

## 2010-10-05 NOTE — Op Note (Signed)
NAMECHARLTON, BOULE               ACCOUNT NO.:  [id number]   MEDICAL RECORD NO.:  [id number]          PATIENT TYPE:  AMB   LOCATION:  SDS                          FACILITY:  [facility]   PHYSICIAN:  Balinda Quails, M.D.    DATE OF BIRTH:  April 19, 1941   DATE OF PROCEDURE:  03/26/2008  DATE OF DISCHARGE:  03/26/2008                               OPERATIVE REPORT   DIAGNOSIS:  A 6-cm infrarenal abdominal aortic aneurysm.   PROCEDURE:  Abdominal aortogram with pelvic runoff arteriography.   ACCESS:  Right common femoral artery with 5-French sheath.   CONTRAST:  Visipaque 85 mL.   COMPLICATIONS:  None apparent.   CLINICAL NOTE:  Henry Allen is a 70 year old gentleman with an  enlarging abdominal aortic aneurysm, now measuring 6 cm.  He also has  bilateral common iliac dilatation.  He has undergone a CT scan which  reveals evidence of enlargement of his aneurysm, now measuring 7.5 cm.  The infrarenal neck has marked atheromatous change and laminated  thrombus.  Two left renal arteries were noted.  The superior most has a  significant stenosis.  The right renal artery reveals a broad  infundibulum.   The patient is brought to the cath lab at this time for diagnostic  arteriography.   PROCEDURE NOTE:  The patient was brought to the cath lab in stable  condition.  Placed in supine position.  Both groins were prepped and  draped in a sterile fashion.  Skin and subcutaneous tissues were  instilled with 1% Xylocaine.   Right common femoral artery accessed with an 18 gauge needle.  A 0.035 J-  wire was advanced through the needle into the mid abdominal aorta.  A 5-  French sheath was advanced over the guidewire and flushed with heparin  saline solution.   A graduated pigtail catheter was advanced over the guidewire to the mid  abdominal aorta.  Standard AP and mid abdominal aortogram obtained.  This revealed a wide infundibulum to the right renal artery.  The left  superior pole renal  artery did reveal a moderate stenosis.  There was a  smaller inferior pole in left accessory renal artery.  Marked  atheromatous change was noted in the infrarenal neck.   The lateral aortogram revealed mild anterior angulation.  Patent  superior mesenteric artery.   A large infrarenal abdominal aortic aneurysm was present.  This tapered  at the level of the iliac arteries bilaterally.  Mild iliac dilatation  noted bilaterally.  The hypogastric arteries were patent bilaterally.  Normal flow was present to the level of common femoral artery.   The patient tolerated the procedure well.  No apparent complications.  Guidewire was reinserted.  Catheter was removed.  Right femoral sheath  was removed.   FINAL IMPRESSION:  1. A 7.5 cm infrarenal abdominal aortic aneurysm.  2. Marked atherosclerotic change, infrarenal aortic neck.  3. Left superior pole renal artery stenosis, moderate severity.  4. Bilateral iliac ectasia.   DISPOSITION:  These results will be reviewed further with the patient  and final disposition, likely requiring open operative repair.  Balinda Quails, M.D.  Electronically Signed     PGH/MEDQ  D:  03/26/2008  T:  03/27/2008  Job:  [id number]

## 2010-10-05 NOTE — Discharge Summary (Signed)
Henry Allen, Henry Allen               ACCOUNT NO.:  [id number]   MEDICAL RECORD NO.:  [id number]          PATIENT TYPE:  INP   LOCATION:  2015                         FACILITY:  [facility]   PHYSICIAN:  Evelene Croon, M.D.     DATE OF BIRTH:  Mar 10, 1941   DATE OF ADMISSION:  10/31/2007  DATE OF DISCHARGE:  11/06/2007                               DISCHARGE SUMMARY   ADMITTING DIAGNOSES:  1. Acute myocardial infarction.  2. 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  3. New onset atrial fibrillation.  4. Pericarditis.  5. History of hypertension.  6. History of cerebrovascular accident.   DISCHARGE DIAGNOSES:  1. Acute myocardial infarction.  2. 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  3. New onset atrial fibrillation with conversion to normal sinus      rhythm.  4. Pericarditis.  5. History of hypertension.  6. History of cerebrovascular accident.   PROCEDURES:  1. Cardiac catheterization performed by Dr. Gala Romney on October 31, 2007.  2. CABG x4 (LIMA to LAD, SVG to diagonal branch of the LAD, SVG to      intermediate coronary artery, SVG to acute marginal branch of the      right coronary with endoscopic vein harvesting in the right lower      extremity by Dr. Laneta Simmers on November 02, 2007).   BRIEF HOSPITAL COURSE STAY:  This is a 70 year old Caucasian male with a  history of tobacco use, hypertension, and CVA who presented with chest  discomfort to [facility] Emergency Room.  The patient was later  transferred to [facility] for further evaluation and treatment.  According to medical records, the patient had at least a 2-week history  of stuttering chest pain, associated with this was left arm numbness  and diaphoresis.  The patient underwent cardiac catheterization that  showed a high-grade stenosis in a small-to-moderate size first diagonal  branch.  The LAD was occluded beyond this with filling via the right  coronary artery.  The intermediate vessel was a large vessel with a high-  grade proximal stenosis.  The right coronary artery was also severely  diseased with a high-grade proximal stenosis.  The patient went into  atrial fibrillation with a rate in the 130, 180s.  Early on the morning  of November 01, 2007, he was placed on a Cardizem drip.  He was also given  digoxin IV.  It should also be noted he had already been previously  started on a heparin drip as well.  The patient eventually converted to  normal sinus rhythm.  He underwent the aforementioned aortocoronary  bypass surgery.  It should be noted that the patient was found to have  severe diffuse acute pericarditis.  The visceral and parietal  pericardium were extremely thickened and inflamed, and there was  obliteration of the pericardial space.  Postoperatively, the patient had  PAF.  He was on an amiodarone drip, then placed on p.o. amiodarone.  He  was extubated.  On the  evening of November 02, 2007, chest tubes were  removed.  On postop day #1, a followup x-ray revealed no pneumothorax.  The patient was volume overloaded and diuresed accordingly.  He also was  anemic postoperatively.  Last H&H today was 8.4 and 24.8 respectively.  He had already been placed on oral Niferex.  The patient was also given  prednisone postoperatively for the pericarditis, this will be tapered  down as an outpatient.  The patient did convert to normal sinus rhythm.  The patient was doing well with cardiac rehab, and he continued to  improve such that by currently on postoperative day #4, he is afebrile,  heart rate 60s, BP 169/85, preop weight was 86, today's weight was 85.6.  On physical examination, cardiac, regular rate and rhythm.  Respiratory,  clear to auscultation bilaterally.  Abdomen is essentially benign.  Extremities, mild edema, right greater than left.  Sternal and lower  extremity wounds were clean and dry and continuing to heal.  Pacing   wires have been removed.  Chest tube sutures will be removed today as  well and per Dr. Laneta Simmers, the patient will be discharged home.  Last  chest x-ray was done, November 05, 2007, which showed small bilateral  pleural effusions and atelectasis, left greater than right.  No  pneumothorax.  Latest laboratory studies, BMET done today, potassium 4,  BUN and creatinine of 30 and 1.21 respectively.  CBC also done today,  H&H 8.4 and 24.8, platelet count 359,000 and white count down to 20,900.   DISCHARGE INSTRUCTIONS:  Include the following, the patient's hemoglobin  A1c was 6, and it is recommended that he stay on a carbohydrate-modified  medium caloric diet and follow up with his primary care physician to  monitor his hemoglobin A1c.  He is not to drive or lift more than 10  pounds until instructed to do so.  He is to continue with breathing  exercises daily, to walk everyday and increase his frequency and  duration as tolerated.  His followup appointments include his primary  care physician Dr. Sherwood Gambler in [place], 1-2 weeks.  He needs to call his  office for an appointment.  The patient needs to call [facility]  to make an appointment to follow up for 2 weeks.  The patient has  appointment to see Dr. Laneta Simmers on November 27, 2007.  Prior to this  appointment, a chest x-ray will be obtained.   DISCHARGE MEDICATIONS:  Include the following,  1. NicoDerm CQ 14 mg/24-hour patch, remove old patch before applying      new patch.  2. ECASA 81 mg p.o. daily.  3. Lopressor 25 mg p.o. twice daily.  4. Amiodarone 200 mg p.o. twice daily.  5. Zocor 20 mg p.o. at bedtime.  6. Niferex 150 mg p.o. daily.  7. Lisinopril 20 mg p.o. daily.  8. Ultram 50 mg 1-2 tablets p.o. q.4-6h. as needed for pain relief.  9. Lasix 40 mg p.o. daily x5 days.  10.KCl 20 mEq p.o. daily x5 days.  11.Prednisone 10 mg to be tapered as follows; 3 p.o. for 4 days, then      2 p.o. for 4 days, then 1 p.o. for 3 days, then 1/2  tablet p.o. for      3 days, then the patient may stop.      Doree Fudge, [place]      Evelene Croon, M.D.  Electronically Signed    DZ/MEDQ  D:  11/06/2007  T:  11/07/2007  Job:  [id number]   cc:   Cecil Cranker, MD, [facility]  Madelin Rear. Sherwood Gambler, MD

## 2011-02-17 LAB — CREATININE, SERUM
Creatinine, Ser: 1.19
GFR calc Af Amer: >60

## 2011-02-17 LAB — POCT I-STAT 3, ART BLOOD GAS (G3+)
Acid-Base Excess: 1
Acid-Base Excess: 1
Acid-Base Excess: 1
Bicarbonate: 24.9 — ABNORMAL HIGH
Bicarbonate: 25.9 — ABNORMAL HIGH
Bicarbonate: 26 — ABNORMAL HIGH
Bicarbonate: 26.1 — ABNORMAL HIGH
Bicarbonate: 26.1 — ABNORMAL HIGH
O2 Saturation: 100
O2 Saturation: 100
O2 Saturation: 100
O2 Saturation: 94
O2 Saturation: 97
Operator id: 273681
Operator id: 297351
Operator id: 3291
Operator id: 3291
Operator id: 3390
Patient temperature: 33
Patient temperature: 36.4
Patient temperature: 37.2
TCO2: 26
TCO2: 27
TCO2: 27
TCO2: 27
TCO2: 27
pCO2 arterial: 35.4
pCO2 arterial: 39.1
pCO2 arterial: 40.7
pCO2 arterial: 42.5
pCO2 arterial: 43.5
pH, Arterial: 7.38
pH, Arterial: 7.396
pH, Arterial: 7.413
pH, Arterial: 7.414
pH, Arterial: 7.458 — ABNORMAL HIGH
pO2, Arterial: 211 — ABNORMAL HIGH
pO2, Arterial: 231 — ABNORMAL HIGH
pO2, Arterial: 313 — ABNORMAL HIGH
pO2, Arterial: 72 — ABNORMAL LOW
pO2, Arterial: 89

## 2011-02-17 LAB — POCT I-STAT 4, (NA,K, GLUC, HGB,HCT)
Glucose, Bld: 108 — ABNORMAL HIGH
Glucose, Bld: 111 — ABNORMAL HIGH
Glucose, Bld: 111 — ABNORMAL HIGH
Glucose, Bld: 111 — ABNORMAL HIGH
Glucose, Bld: 112 — ABNORMAL HIGH
Glucose, Bld: 120 — ABNORMAL HIGH
Glucose, Bld: 120 — ABNORMAL HIGH
Glucose, Bld: 139 — ABNORMAL HIGH
Glucose, Bld: 146 — ABNORMAL HIGH
HCT: 22 — ABNORMAL LOW
HCT: 24 — ABNORMAL LOW
HCT: 24 — ABNORMAL LOW
HCT: 25 — ABNORMAL LOW
HCT: 25 — ABNORMAL LOW
HCT: 25 — ABNORMAL LOW
HCT: 27 — ABNORMAL LOW
HCT: 30 — ABNORMAL LOW
HCT: 35 — ABNORMAL LOW
Hemoglobin: 10.2 — ABNORMAL LOW
Hemoglobin: 11.9 — ABNORMAL LOW
Hemoglobin: 7.5 — CL
Hemoglobin: 8.2 — ABNORMAL LOW
Hemoglobin: 8.2 — ABNORMAL LOW
Hemoglobin: 8.5 — ABNORMAL LOW
Hemoglobin: 8.5 — ABNORMAL LOW
Hemoglobin: 8.5 — ABNORMAL LOW
Hemoglobin: 9.2 — ABNORMAL LOW
Operator id: 297351
Operator id: 3291
Operator id: 3291
Operator id: 3291
Operator id: 3291
Operator id: 3291
Operator id: 3291
Operator id: 3390
Operator id: 3390
Potassium: 3.8
Potassium: 3.8
Potassium: 4
Potassium: 4.5
Potassium: 5
Potassium: 5.5 — ABNORMAL HIGH
Potassium: 5.6 — ABNORMAL HIGH
Potassium: 6.1 — ABNORMAL HIGH
Potassium: 6.4
Sodium: 131 — ABNORMAL LOW
Sodium: 132 — ABNORMAL LOW
Sodium: 133 — ABNORMAL LOW
Sodium: 133 — ABNORMAL LOW
Sodium: 134 — ABNORMAL LOW
Sodium: 135
Sodium: 136
Sodium: 138
Sodium: 140

## 2011-02-17 LAB — POCT I-STAT, CHEM 8
BUN: 17
BUN: 23
Calcium, Ion: 1.08 — ABNORMAL LOW
Calcium, Ion: 1.1 — ABNORMAL LOW
Chloride: 101
Chloride: 104
Creatinine, Ser: 1
Creatinine, Ser: 1.4
Glucose, Bld: 132 — ABNORMAL HIGH
Glucose, Bld: 182 — ABNORMAL HIGH
HCT: 24 — ABNORMAL LOW
HCT: 25 — ABNORMAL LOW
Hemoglobin: 8.2 — ABNORMAL LOW
Hemoglobin: 8.5 — ABNORMAL LOW
Potassium: 4.3
Potassium: 4.7
Sodium: 137
Sodium: 137
TCO2: 22
TCO2: 27

## 2011-02-17 LAB — CBC
HCT: 24.5 — ABNORMAL LOW
HCT: 24.9 — ABNORMAL LOW
HCT: 25.4 — ABNORMAL LOW
HCT: 34.6 — ABNORMAL LOW
HCT: 35.9 — ABNORMAL LOW
HCT: 38.5 — ABNORMAL LOW
HCT: 24.8 — ABNORMAL LOW
HCT: 26.4 — ABNORMAL LOW
Hemoglobin: 12.4 — ABNORMAL LOW
Hemoglobin: 12.9 — ABNORMAL LOW
Hemoglobin: 8.4 — ABNORMAL LOW
Hemoglobin: 8.9 — ABNORMAL LOW
MCHC: 33.4
MCHC: 33.6
MCHC: 33.9
MCHC: 33.9
MCHC: 34
MCHC: 34.5
MCHC: 33.9
MCV: 84.3
MCV: 84.7
MCV: 84.8
MCV: 84.9
MCV: 85
MCV: 85
MCV: 85.4
Platelets: 233
Platelets: 274
Platelets: 369
Platelets: 450 — ABNORMAL HIGH
Platelets: 498 — ABNORMAL HIGH
Platelets: 324
RBC: 2.88 — ABNORMAL LOW
RBC: 2.93 — ABNORMAL LOW
RBC: 2.99 — ABNORMAL LOW
RBC: 4.24
RBC: 4.53
RBC: 2.91 — ABNORMAL LOW
RBC: 3.1 — ABNORMAL LOW
RDW: 14
RDW: 14.6
RDW: 14.7
RDW: 14.7
RDW: 14.8
RDW: 14.8
RDW: 15
WBC: 12.9 — ABNORMAL HIGH
WBC: 13.4 — ABNORMAL HIGH
WBC: 14 — ABNORMAL HIGH
WBC: 14.4 — ABNORMAL HIGH
WBC: 18.3 — ABNORMAL HIGH
WBC: 20.9 — ABNORMAL HIGH
WBC: 21.5 — ABNORMAL HIGH

## 2011-02-17 LAB — DIFFERENTIAL
Basophils Absolute: 0.1
Basophils Relative: 1
Eosinophils Absolute: 0.2
Eosinophils Relative: 2
Lymphocytes Relative: 7 — ABNORMAL LOW

## 2011-02-17 LAB — URINALYSIS, ROUTINE W REFLEX MICROSCOPIC
Glucose, UA: NEGATIVE
Hgb urine dipstick: NEGATIVE
Nitrite: NEGATIVE
Protein, ur: NEGATIVE
Specific Gravity, Urine: 1.025
Urobilinogen, UA: 0.2
pH: 5

## 2011-02-17 LAB — CK TOTAL AND CKMB (NOT AT ARMC)
CK, MB: 1.6
CK, MB: 1.8
CK, MB: 2
Relative Index: INVALID
Relative Index: INVALID
Relative Index: INVALID
Total CK: 33
Total CK: 43
Total CK: 55

## 2011-02-17 LAB — PREPARE FRESH FROZEN PLASMA

## 2011-02-17 LAB — BASIC METABOLIC PANEL
BUN: 17
BUN: 24 — ABNORMAL HIGH
BUN: 29 — ABNORMAL HIGH
BUN: 30 — ABNORMAL HIGH
CO2: 28
CO2: 29
CO2: 30
Calcium: 7.9 — ABNORMAL LOW
Calcium: 8.7
Calcium: 8.4
Chloride: 101
Chloride: 106
Chloride: 106
Chloride: 108
Creatinine, Ser: 1
Creatinine, Ser: 1.04
Creatinine, Ser: 1.07
Creatinine, Ser: 1.18
GFR calc Af Amer: >60
GFR calc Af Amer: >60
GFR calc Af Amer: >60
GFR calc non Af Amer: >60
GFR calc non Af Amer: >60
GFR calc non Af Amer: >60
Glucose, Bld: 119 — ABNORMAL HIGH
Glucose, Bld: 126 — ABNORMAL HIGH
Glucose, Bld: 96
Potassium: 4.1
Potassium: 4.3
Potassium: 4
Potassium: 4.3
Sodium: 139
Sodium: 141
Sodium: 143

## 2011-02-17 LAB — COMPREHENSIVE METABOLIC PANEL
ALT: 111 — ABNORMAL HIGH
ALT: 87 — ABNORMAL HIGH
AST: 53 — ABNORMAL HIGH
Albumin: 2.6 — ABNORMAL LOW
Alkaline Phosphatase: 221 — ABNORMAL HIGH
CO2: 28
Calcium: 9
Calcium: 9.1
Creatinine, Ser: 0.98
GFR calc Af Amer: >60
Sodium: 139
Total Bilirubin: 1.7 — ABNORMAL HIGH
Total Protein: 5.9 — ABNORMAL LOW
Total Protein: 6.3

## 2011-02-17 LAB — MAGNESIUM
Magnesium: 2.6 — ABNORMAL HIGH
Magnesium: 3.1 — ABNORMAL HIGH

## 2011-02-17 LAB — PREPARE CRYOPRECIPITATE

## 2011-02-17 LAB — POCT I-STAT GLUCOSE
Glucose, Bld: 117 — ABNORMAL HIGH
Operator id: 3390

## 2011-02-17 LAB — APTT: aPTT: 41 — ABNORMAL HIGH

## 2011-02-17 LAB — BLOOD GAS, ARTERIAL
Acid-Base Excess: 1.7
TCO2: 26.4
pCO2 arterial: 36.5
pH, Arterial: 7.455 — ABNORMAL HIGH
pO2, Arterial: 79.1 — ABNORMAL LOW

## 2011-02-17 LAB — POCT I-STAT 3, VENOUS BLOOD GAS (G3P V)
Acid-base deficit: 1
Bicarbonate: 25.5 — ABNORMAL HIGH
O2 Saturation: 81
Operator id: 3390
Patient temperature: 33
TCO2: 27
pCO2, Ven: 42.9 — ABNORMAL LOW
pH, Ven: 7.363 — ABNORMAL HIGH
pO2, Ven: 38

## 2011-02-17 LAB — LIPID PANEL
Cholesterol: 157
HDL: 20 — ABNORMAL LOW
LDL Cholesterol: 124 — ABNORMAL HIGH
Total CHOL/HDL Ratio: 7.9
Triglycerides: 63
VLDL: 13

## 2011-02-17 LAB — PROTIME-INR
INR: 0.8
INR: 1.1
Prothrombin Time: 11.3 — ABNORMAL LOW

## 2011-02-17 LAB — HEPARIN LEVEL (UNFRACTIONATED)
Heparin Unfractionated: 0.1 — ABNORMAL LOW
Heparin Unfractionated: 0.12 — ABNORMAL LOW
Heparin Unfractionated: 0.22 — ABNORMAL LOW

## 2011-02-17 LAB — POCT CARDIAC MARKERS
CKMB, poc: 1.5
Operator id: 106841
Troponin i, poc: <0.05

## 2011-02-17 LAB — TSH: TSH: 0.365

## 2011-02-17 LAB — PREPARE PLATELET PHERESIS

## 2011-02-17 LAB — COMPREHENSIVE METABOLIC PANEL WITH GFR
Alkaline Phosphatase: 303 — ABNORMAL HIGH
BUN: 17
CO2: 24
Chloride: 103
Creatinine, Ser: 0.94
GFR calc non Af Amer: >60
Glucose, Bld: 122 — ABNORMAL HIGH
Potassium: 4
Total Bilirubin: 1.5 — ABNORMAL HIGH

## 2011-02-17 LAB — HEMOGLOBIN AND HEMATOCRIT, BLOOD
HCT: 24 — ABNORMAL LOW
Hemoglobin: 8.1 — ABNORMAL LOW

## 2011-02-17 LAB — HEMOGLOBIN A1C
Hgb A1c MFr Bld: 6
Mean Plasma Glucose: 136

## 2011-02-17 LAB — B-NATRIURETIC PEPTIDE (CONVERTED LAB): Pro B Natriuretic peptide (BNP): 207 — ABNORMAL HIGH

## 2011-02-17 LAB — TROPONIN I
Troponin I: 0.05
Troponin I: 0.06

## 2011-02-17 LAB — D-DIMER, QUANTITATIVE: D-Dimer, Quant: 5.54 — ABNORMAL HIGH

## 2011-02-17 LAB — ABO/RH: ABO/RH(D): A POS

## 2011-02-17 LAB — BASIC METABOLIC PANEL WITH GFR
BUN: 21
GFR calc non Af Amer: >60
Glucose, Bld: 114 — ABNORMAL HIGH
Potassium: 4.3

## 2011-02-17 LAB — PLATELET COUNT: Platelets: 270

## 2011-02-22 LAB — POCT I-STAT, CHEM 8
BUN: 29 — ABNORMAL HIGH
Calcium, Ion: 1.09 — ABNORMAL LOW
Creatinine, Ser: 1.5
Glucose, Bld: 109 — ABNORMAL HIGH
TCO2: 28

## 2011-02-25 LAB — BASIC METABOLIC PANEL
CO2: 24 mEq/L (ref 19–32)
CO2: 24 mEq/L (ref 19–32)
Calcium: 7.7 mg/dL — ABNORMAL LOW (ref 8.4–10.5)
Calcium: 7.9 mg/dL — ABNORMAL LOW (ref 8.4–10.5)
Calcium: 8 mg/dL — ABNORMAL LOW (ref 8.4–10.5)
Chloride: 108 mEq/L (ref 96–112)
Creatinine, Ser: 1.49 mg/dL (ref 0.4–1.5)
GFR calc Af Amer: 46 mL/min — ABNORMAL LOW (ref >60)
GFR calc Af Amer: 57 mL/min — ABNORMAL LOW (ref >60)
GFR calc non Af Amer: 47 mL/min — ABNORMAL LOW (ref >60)
Glucose, Bld: 130 mg/dL — ABNORMAL HIGH (ref 70–99)
Potassium: 3.9 mEq/L (ref 3.5–5.1)
Potassium: 4.2 mEq/L (ref 3.5–5.1)
Sodium: 136 mEq/L (ref 135–145)
Sodium: 136 mEq/L (ref 135–145)
Sodium: 138 mEq/L (ref 135–145)

## 2011-02-25 LAB — URINALYSIS, ROUTINE W REFLEX MICROSCOPIC
Bilirubin Urine: NEGATIVE
Glucose, UA: NEGATIVE mg/dL
Ketones, ur: NEGATIVE mg/dL
Protein, ur: NEGATIVE mg/dL

## 2011-02-25 LAB — CBC
HCT: 31.7 % — ABNORMAL LOW (ref 39.0–52.0)
Hemoglobin: 10.9 g/dL — ABNORMAL LOW (ref 13.0–17.0)
Hemoglobin: 11.4 g/dL — ABNORMAL LOW (ref 13.0–17.0)
Hemoglobin: 11.4 g/dL — ABNORMAL LOW (ref 13.0–17.0)
Hemoglobin: 14.5 g/dL (ref 13.0–17.0)
MCHC: 32.8 g/dL (ref 30.0–36.0)
MCHC: 33.4 g/dL (ref 30.0–36.0)
MCHC: 33.6 g/dL (ref 30.0–36.0)
MCHC: 34.2 g/dL (ref 30.0–36.0)
MCV: 85.9 fL (ref 78.0–100.0)
RBC: 3.7 MIL/uL — ABNORMAL LOW (ref 4.22–5.81)
RBC: 3.95 MIL/uL — ABNORMAL LOW (ref 4.22–5.81)
RBC: 3.97 MIL/uL — ABNORMAL LOW (ref 4.22–5.81)
RBC: 5.03 MIL/uL (ref 4.22–5.81)
RDW: 16.6 % — ABNORMAL HIGH (ref 11.5–15.5)

## 2011-02-25 LAB — GLUCOSE, CAPILLARY
Glucose-Capillary: 100 mg/dL — ABNORMAL HIGH (ref 70–99)
Glucose-Capillary: 117 mg/dL — ABNORMAL HIGH (ref 70–99)
Glucose-Capillary: 119 mg/dL — ABNORMAL HIGH (ref 70–99)
Glucose-Capillary: 121 mg/dL — ABNORMAL HIGH (ref 70–99)
Glucose-Capillary: 126 mg/dL — ABNORMAL HIGH (ref 70–99)
Glucose-Capillary: 129 mg/dL — ABNORMAL HIGH (ref 70–99)
Glucose-Capillary: 133 mg/dL — ABNORMAL HIGH (ref 70–99)
Glucose-Capillary: 141 mg/dL — ABNORMAL HIGH (ref 70–99)
Glucose-Capillary: 149 mg/dL — ABNORMAL HIGH (ref 70–99)

## 2011-02-25 LAB — POCT I-STAT 4, (NA,K, GLUC, HGB,HCT)
Glucose, Bld: 138 mg/dL — ABNORMAL HIGH (ref 70–99)
HCT: 30 % — ABNORMAL LOW (ref 39.0–52.0)
Hemoglobin: 10.2 g/dL — ABNORMAL LOW (ref 13.0–17.0)
Potassium: 3.8 mEq/L (ref 3.5–5.1)
Sodium: 139 mEq/L (ref 135–145)

## 2011-02-25 LAB — COMPREHENSIVE METABOLIC PANEL
ALT: 16 U/L (ref 0–53)
ALT: 16 U/L (ref 0–53)
AST: 18 U/L (ref 0–37)
Alkaline Phosphatase: 37 U/L — ABNORMAL LOW (ref 39–117)
CO2: 22 mEq/L (ref 19–32)
CO2: 27 mEq/L (ref 19–32)
Calcium: 10.1 mg/dL (ref 8.4–10.5)
Calcium: 7.6 mg/dL — ABNORMAL LOW (ref 8.4–10.5)
Chloride: 105 mEq/L (ref 96–112)
Creatinine, Ser: 1.27 mg/dL (ref 0.4–1.5)
GFR calc Af Amer: 60 mL/min — ABNORMAL LOW (ref >60)
GFR calc non Af Amer: 57 mL/min — ABNORMAL LOW (ref >60)
Glucose, Bld: 164 mg/dL — ABNORMAL HIGH (ref 70–99)
Glucose, Bld: 99 mg/dL (ref 70–99)
Potassium: 3.4 mEq/L — ABNORMAL LOW (ref 3.5–5.1)
Sodium: 133 mEq/L — ABNORMAL LOW (ref 135–145)
Sodium: 141 mEq/L (ref 135–145)
Total Bilirubin: 1 mg/dL (ref 0.3–1.2)
Total Protein: 4.1 g/dL — ABNORMAL LOW (ref 6.0–8.3)

## 2011-02-25 LAB — BLOOD GAS, ARTERIAL
Acid-Base Excess: 0.1 mmol/L (ref 0.0–2.0)
Acid-Base Excess: 3.6 mmol/L — ABNORMAL HIGH (ref 0.0–2.0)
Bicarbonate: 27.7 mEq/L — ABNORMAL HIGH (ref 20.0–24.0)
O2 Saturation: 94.4 %
O2 Saturation: 94.6 %
Patient temperature: 98.6
TCO2: 27.3 mmol/L (ref 0–100)
pCO2 arterial: 53 mmHg — ABNORMAL HIGH (ref 35.0–45.0)
pO2, Arterial: 69.6 mmHg — ABNORMAL LOW (ref 80.0–100.0)

## 2011-02-25 LAB — PROTIME-INR
INR: 1.2 (ref 0.00–1.49)
Prothrombin Time: 12.8 seconds (ref 11.6–15.2)
Prothrombin Time: 15.6 seconds — ABNORMAL HIGH (ref 11.6–15.2)

## 2011-02-25 LAB — TYPE AND SCREEN

## 2011-02-25 LAB — APTT: aPTT: 31 seconds (ref 24–37)

## 2012-06-08 ENCOUNTER — Other Ambulatory Visit (HOSPITAL_COMMUNITY): Payer: Self-pay | Admitting: Cardiovascular Disease

## 2012-06-08 DIAGNOSIS — I150 Renovascular hypertension: Secondary | ICD-10-CM

## 2012-07-03 ENCOUNTER — Encounter (HOSPITAL_COMMUNITY): Payer: Self-pay

## 2012-07-12 ENCOUNTER — Ambulatory Visit (HOSPITAL_COMMUNITY)
Admission: RE | Admit: 2012-07-12 | Discharge: 2012-07-12 | Disposition: A | Discharge status: Home / Self Care | Payer: Medicare HMO | Source: Ambulatory Visit | Attending: Cardiovascular Disease | Admitting: Cardiovascular Disease

## 2012-07-12 DIAGNOSIS — I150 Renovascular hypertension: Secondary | ICD-10-CM

## 2012-07-12 NOTE — Progress Notes (Signed)
Renal Duplex Completed. Henry Allen D  

## 2012-09-10 ENCOUNTER — Encounter: Payer: Self-pay | Admitting: *Deleted

## 2012-09-28 ENCOUNTER — Other Ambulatory Visit: Payer: Self-pay | Admitting: Internal Medicine

## 2012-09-28 DIAGNOSIS — K625 Hemorrhage of anus and rectum: Secondary | ICD-10-CM

## 2012-11-07 ENCOUNTER — Ambulatory Visit: Payer: Medicare HMO | Admitting: Cardiovascular Disease

## 2014-10-21 DIAGNOSIS — Z6831 Body mass index (BMI) 31.0-31.9, adult: Secondary | ICD-10-CM | POA: Diagnosis not present

## 2014-10-21 DIAGNOSIS — N400 Enlarged prostate without lower urinary tract symptoms: Secondary | ICD-10-CM | POA: Diagnosis not present

## 2014-10-21 DIAGNOSIS — E6609 Other obesity due to excess calories: Secondary | ICD-10-CM | POA: Diagnosis not present

## 2014-10-21 DIAGNOSIS — Z0000 Encounter for general adult medical examination without abnormal findings: Secondary | ICD-10-CM | POA: Diagnosis not present

## 2014-10-21 DIAGNOSIS — I2510 Atherosclerotic heart disease of native coronary artery without angina pectoris: Secondary | ICD-10-CM | POA: Diagnosis not present

## 2014-10-21 DIAGNOSIS — I10 Essential (primary) hypertension: Secondary | ICD-10-CM | POA: Diagnosis not present

## 2015-01-16 ENCOUNTER — Ambulatory Visit (INDEPENDENT_AMBULATORY_CARE_PROVIDER_SITE_OTHER): Payer: Medicare Other | Admitting: Urology

## 2015-01-16 DIAGNOSIS — R972 Elevated prostate specific antigen [PSA]: Secondary | ICD-10-CM

## 2015-01-16 DIAGNOSIS — N401 Enlarged prostate with lower urinary tract symptoms: Secondary | ICD-10-CM

## 2015-01-16 DIAGNOSIS — R3912 Poor urinary stream: Secondary | ICD-10-CM | POA: Diagnosis not present

## 2015-01-16 DIAGNOSIS — Z87438 Personal history of other diseases of male genital organs: Secondary | ICD-10-CM | POA: Diagnosis not present

## 2015-04-21 DIAGNOSIS — R972 Elevated prostate specific antigen [PSA]: Secondary | ICD-10-CM | POA: Diagnosis not present

## 2015-04-24 ENCOUNTER — Ambulatory Visit (INDEPENDENT_AMBULATORY_CARE_PROVIDER_SITE_OTHER): Payer: Medicare Other | Admitting: Urology

## 2015-04-24 DIAGNOSIS — R972 Elevated prostate specific antigen [PSA]: Secondary | ICD-10-CM | POA: Diagnosis not present

## 2015-04-24 DIAGNOSIS — R3912 Poor urinary stream: Secondary | ICD-10-CM

## 2015-04-24 DIAGNOSIS — N401 Enlarged prostate with lower urinary tract symptoms: Secondary | ICD-10-CM | POA: Diagnosis not present

## 2015-08-25 DIAGNOSIS — R972 Elevated prostate specific antigen [PSA]: Secondary | ICD-10-CM | POA: Diagnosis not present

## 2015-08-28 ENCOUNTER — Ambulatory Visit (INDEPENDENT_AMBULATORY_CARE_PROVIDER_SITE_OTHER): Payer: Medicare Other | Admitting: Urology

## 2015-08-28 DIAGNOSIS — R3912 Poor urinary stream: Secondary | ICD-10-CM | POA: Diagnosis not present

## 2015-08-28 DIAGNOSIS — R972 Elevated prostate specific antigen [PSA]: Secondary | ICD-10-CM | POA: Diagnosis not present

## 2015-08-28 DIAGNOSIS — N401 Enlarged prostate with lower urinary tract symptoms: Secondary | ICD-10-CM | POA: Diagnosis not present

## 2015-10-07 DIAGNOSIS — Z1389 Encounter for screening for other disorder: Secondary | ICD-10-CM | POA: Diagnosis not present

## 2015-10-07 DIAGNOSIS — N400 Enlarged prostate without lower urinary tract symptoms: Secondary | ICD-10-CM | POA: Diagnosis not present

## 2015-10-07 DIAGNOSIS — I10 Essential (primary) hypertension: Secondary | ICD-10-CM | POA: Diagnosis not present

## 2015-10-07 DIAGNOSIS — I2510 Atherosclerotic heart disease of native coronary artery without angina pectoris: Secondary | ICD-10-CM | POA: Diagnosis not present

## 2015-10-15 DIAGNOSIS — Z1389 Encounter for screening for other disorder: Secondary | ICD-10-CM | POA: Diagnosis not present

## 2015-10-15 DIAGNOSIS — W57XXXA Bitten or stung by nonvenomous insect and other nonvenomous arthropods, initial encounter: Secondary | ICD-10-CM | POA: Diagnosis not present

## 2015-10-15 DIAGNOSIS — S80262A Insect bite (nonvenomous), left knee, initial encounter: Secondary | ICD-10-CM | POA: Diagnosis not present

## 2015-11-23 DIAGNOSIS — R972 Elevated prostate specific antigen [PSA]: Secondary | ICD-10-CM | POA: Diagnosis not present

## 2015-11-27 ENCOUNTER — Ambulatory Visit (INDEPENDENT_AMBULATORY_CARE_PROVIDER_SITE_OTHER): Payer: Medicare Other | Admitting: Urology

## 2015-11-27 DIAGNOSIS — R3912 Poor urinary stream: Secondary | ICD-10-CM | POA: Diagnosis not present

## 2015-11-27 DIAGNOSIS — N401 Enlarged prostate with lower urinary tract symptoms: Secondary | ICD-10-CM

## 2015-11-27 DIAGNOSIS — R972 Elevated prostate specific antigen [PSA]: Secondary | ICD-10-CM | POA: Diagnosis not present

## 2016-03-01 DIAGNOSIS — R972 Elevated prostate specific antigen [PSA]: Secondary | ICD-10-CM | POA: Diagnosis not present

## 2016-03-04 ENCOUNTER — Ambulatory Visit (INDEPENDENT_AMBULATORY_CARE_PROVIDER_SITE_OTHER): Payer: Medicare Other | Admitting: Urology

## 2016-03-04 ENCOUNTER — Other Ambulatory Visit (HOSPITAL_COMMUNITY)
Admission: RE | Admit: 2016-03-04 | Discharge: 2016-03-04 | Disposition: A | Discharge status: Home / Self Care | Payer: Medicare Other | Source: Other Acute Inpatient Hospital | Attending: Urology | Admitting: Urology

## 2016-03-04 DIAGNOSIS — N401 Enlarged prostate with lower urinary tract symptoms: Secondary | ICD-10-CM

## 2016-03-04 DIAGNOSIS — R3912 Poor urinary stream: Secondary | ICD-10-CM

## 2016-03-04 DIAGNOSIS — R972 Elevated prostate specific antigen [PSA]: Secondary | ICD-10-CM

## 2016-03-04 DIAGNOSIS — R3121 Asymptomatic microscopic hematuria: Secondary | ICD-10-CM | POA: Diagnosis not present

## 2016-03-04 LAB — URINE MICROSCOPIC-ADD ON
BACTERIA UA: NONE SEEN
SQUAMOUS EPITHELIAL / LPF: NONE SEEN
WBC, UA: NONE SEEN WBC/hpf (ref 0–5)

## 2016-03-04 LAB — URINALYSIS, ROUTINE W REFLEX MICROSCOPIC
Bilirubin Urine: NEGATIVE
GLUCOSE, UA: NEGATIVE mg/dL
Hgb urine dipstick: NEGATIVE
Ketones, ur: NEGATIVE mg/dL
LEUKOCYTES UA: NEGATIVE
Nitrite: NEGATIVE
SPECIFIC GRAVITY, URINE: 1.01 (ref 1.005–1.030)
pH: 7.5 (ref 5.0–8.0)

## 2016-03-06 LAB — URINE CULTURE: Culture: <10000 — AB

## 2016-07-14 DIAGNOSIS — I2510 Atherosclerotic heart disease of native coronary artery without angina pectoris: Secondary | ICD-10-CM | POA: Diagnosis not present

## 2016-07-14 DIAGNOSIS — I10 Essential (primary) hypertension: Secondary | ICD-10-CM | POA: Diagnosis not present

## 2016-07-14 DIAGNOSIS — Z23 Encounter for immunization: Secondary | ICD-10-CM | POA: Diagnosis not present

## 2016-07-14 DIAGNOSIS — E782 Mixed hyperlipidemia: Secondary | ICD-10-CM | POA: Diagnosis not present

## 2016-07-14 DIAGNOSIS — Z0000 Encounter for general adult medical examination without abnormal findings: Secondary | ICD-10-CM | POA: Diagnosis not present

## 2016-07-14 DIAGNOSIS — N400 Enlarged prostate without lower urinary tract symptoms: Secondary | ICD-10-CM | POA: Diagnosis not present

## 2016-07-14 DIAGNOSIS — Z719 Counseling, unspecified: Secondary | ICD-10-CM | POA: Diagnosis not present

## 2016-07-14 DIAGNOSIS — Z1389 Encounter for screening for other disorder: Secondary | ICD-10-CM | POA: Diagnosis not present

## 2016-10-04 DIAGNOSIS — R972 Elevated prostate specific antigen [PSA]: Secondary | ICD-10-CM | POA: Diagnosis not present

## 2016-10-07 ENCOUNTER — Ambulatory Visit (INDEPENDENT_AMBULATORY_CARE_PROVIDER_SITE_OTHER): Payer: Medicare Other | Admitting: Urology

## 2016-10-07 DIAGNOSIS — R3121 Asymptomatic microscopic hematuria: Secondary | ICD-10-CM

## 2016-10-07 DIAGNOSIS — R351 Nocturia: Secondary | ICD-10-CM | POA: Diagnosis not present

## 2016-10-07 DIAGNOSIS — R972 Elevated prostate specific antigen [PSA]: Secondary | ICD-10-CM

## 2016-10-07 DIAGNOSIS — N401 Enlarged prostate with lower urinary tract symptoms: Secondary | ICD-10-CM

## 2017-04-10 DIAGNOSIS — Z0000 Encounter for general adult medical examination without abnormal findings: Secondary | ICD-10-CM | POA: Diagnosis not present

## 2017-04-10 DIAGNOSIS — Z1389 Encounter for screening for other disorder: Secondary | ICD-10-CM | POA: Diagnosis not present

## 2017-04-21 DIAGNOSIS — R972 Elevated prostate specific antigen [PSA]: Secondary | ICD-10-CM | POA: Diagnosis not present

## 2017-04-21 DIAGNOSIS — R739 Hyperglycemia, unspecified: Secondary | ICD-10-CM | POA: Diagnosis not present

## 2017-04-21 DIAGNOSIS — R7309 Other abnormal glucose: Secondary | ICD-10-CM | POA: Diagnosis not present

## 2017-04-21 DIAGNOSIS — Z1389 Encounter for screening for other disorder: Secondary | ICD-10-CM | POA: Diagnosis not present

## 2017-04-21 DIAGNOSIS — Z0000 Encounter for general adult medical examination without abnormal findings: Secondary | ICD-10-CM | POA: Diagnosis not present

## 2017-04-28 ENCOUNTER — Ambulatory Visit (INDEPENDENT_AMBULATORY_CARE_PROVIDER_SITE_OTHER): Payer: Medicare Other | Admitting: Urology

## 2017-04-28 DIAGNOSIS — R351 Nocturia: Secondary | ICD-10-CM

## 2017-04-28 DIAGNOSIS — R972 Elevated prostate specific antigen [PSA]: Secondary | ICD-10-CM | POA: Diagnosis not present

## 2017-04-28 DIAGNOSIS — N401 Enlarged prostate with lower urinary tract symptoms: Secondary | ICD-10-CM | POA: Diagnosis not present

## 2017-11-08 DIAGNOSIS — R972 Elevated prostate specific antigen [PSA]: Secondary | ICD-10-CM | POA: Diagnosis not present

## 2017-11-10 ENCOUNTER — Ambulatory Visit: Payer: Medicare Other | Admitting: Urology

## 2018-01-05 DIAGNOSIS — M1991 Primary osteoarthritis, unspecified site: Secondary | ICD-10-CM | POA: Diagnosis not present

## 2018-01-05 DIAGNOSIS — Z1389 Encounter for screening for other disorder: Secondary | ICD-10-CM | POA: Diagnosis not present

## 2018-01-05 DIAGNOSIS — I10 Essential (primary) hypertension: Secondary | ICD-10-CM | POA: Diagnosis not present

## 2018-01-05 DIAGNOSIS — Z0001 Encounter for general adult medical examination with abnormal findings: Secondary | ICD-10-CM | POA: Diagnosis not present

## 2018-01-05 DIAGNOSIS — Z0000 Encounter for general adult medical examination without abnormal findings: Secondary | ICD-10-CM | POA: Diagnosis not present

## 2018-01-05 DIAGNOSIS — D649 Anemia, unspecified: Secondary | ICD-10-CM | POA: Diagnosis not present

## 2018-02-23 ENCOUNTER — Ambulatory Visit (INDEPENDENT_AMBULATORY_CARE_PROVIDER_SITE_OTHER): Payer: Medicare Other | Admitting: Urology

## 2018-02-23 DIAGNOSIS — R972 Elevated prostate specific antigen [PSA]: Secondary | ICD-10-CM

## 2018-02-23 DIAGNOSIS — N401 Enlarged prostate with lower urinary tract symptoms: Secondary | ICD-10-CM

## 2018-02-23 DIAGNOSIS — R351 Nocturia: Secondary | ICD-10-CM

## 2018-07-31 DIAGNOSIS — Z0001 Encounter for general adult medical examination with abnormal findings: Secondary | ICD-10-CM | POA: Diagnosis not present

## 2018-07-31 DIAGNOSIS — Z13220 Encounter for screening for lipoid disorders: Secondary | ICD-10-CM | POA: Diagnosis not present

## 2018-07-31 DIAGNOSIS — Z1389 Encounter for screening for other disorder: Secondary | ICD-10-CM | POA: Diagnosis not present

## 2018-07-31 DIAGNOSIS — I10 Essential (primary) hypertension: Secondary | ICD-10-CM | POA: Diagnosis not present

## 2018-07-31 DIAGNOSIS — M1991 Primary osteoarthritis, unspecified site: Secondary | ICD-10-CM | POA: Diagnosis not present

## 2018-10-26 ENCOUNTER — Ambulatory Visit (INDEPENDENT_AMBULATORY_CARE_PROVIDER_SITE_OTHER): Payer: Medicare Other | Admitting: Urology

## 2018-10-26 DIAGNOSIS — R3912 Poor urinary stream: Secondary | ICD-10-CM | POA: Diagnosis not present

## 2018-10-26 DIAGNOSIS — R972 Elevated prostate specific antigen [PSA]: Secondary | ICD-10-CM | POA: Diagnosis not present

## 2018-10-26 DIAGNOSIS — N401 Enlarged prostate with lower urinary tract symptoms: Secondary | ICD-10-CM

## 2019-04-22 DIAGNOSIS — M1991 Primary osteoarthritis, unspecified site: Secondary | ICD-10-CM | POA: Diagnosis not present

## 2019-04-22 DIAGNOSIS — I10 Essential (primary) hypertension: Secondary | ICD-10-CM | POA: Diagnosis not present

## 2019-04-22 DIAGNOSIS — I2510 Atherosclerotic heart disease of native coronary artery without angina pectoris: Secondary | ICD-10-CM | POA: Diagnosis not present

## 2019-05-03 ENCOUNTER — Ambulatory Visit (INDEPENDENT_AMBULATORY_CARE_PROVIDER_SITE_OTHER): Payer: Medicare Other | Admitting: Urology

## 2019-05-03 ENCOUNTER — Other Ambulatory Visit: Payer: Self-pay

## 2019-05-03 DIAGNOSIS — R972 Elevated prostate specific antigen [PSA]: Secondary | ICD-10-CM

## 2019-05-03 DIAGNOSIS — R351 Nocturia: Secondary | ICD-10-CM | POA: Diagnosis not present

## 2019-05-03 DIAGNOSIS — N403 Nodular prostate with lower urinary tract symptoms: Secondary | ICD-10-CM

## 2019-05-21 DIAGNOSIS — I509 Heart failure, unspecified: Secondary | ICD-10-CM | POA: Diagnosis not present

## 2019-05-21 DIAGNOSIS — I10 Essential (primary) hypertension: Secondary | ICD-10-CM | POA: Diagnosis not present

## 2019-06-03 ENCOUNTER — Other Ambulatory Visit: Payer: Self-pay

## 2019-06-03 DIAGNOSIS — R972 Elevated prostate specific antigen [PSA]: Secondary | ICD-10-CM

## 2019-07-21 DIAGNOSIS — M1991 Primary osteoarthritis, unspecified site: Secondary | ICD-10-CM | POA: Diagnosis not present

## 2019-07-21 DIAGNOSIS — I2510 Atherosclerotic heart disease of native coronary artery without angina pectoris: Secondary | ICD-10-CM | POA: Diagnosis not present

## 2019-07-21 DIAGNOSIS — I10 Essential (primary) hypertension: Secondary | ICD-10-CM | POA: Diagnosis not present

## 2019-08-19 DIAGNOSIS — Z0001 Encounter for general adult medical examination with abnormal findings: Secondary | ICD-10-CM | POA: Diagnosis not present

## 2019-08-19 DIAGNOSIS — M1991 Primary osteoarthritis, unspecified site: Secondary | ICD-10-CM | POA: Diagnosis not present

## 2019-08-19 DIAGNOSIS — I10 Essential (primary) hypertension: Secondary | ICD-10-CM | POA: Diagnosis not present

## 2019-08-19 DIAGNOSIS — E039 Hypothyroidism, unspecified: Secondary | ICD-10-CM | POA: Diagnosis not present

## 2019-08-19 DIAGNOSIS — Z1389 Encounter for screening for other disorder: Secondary | ICD-10-CM | POA: Diagnosis not present

## 2019-08-19 DIAGNOSIS — J449 Chronic obstructive pulmonary disease, unspecified: Secondary | ICD-10-CM | POA: Diagnosis not present

## 2019-10-21 DIAGNOSIS — Z720 Tobacco use: Secondary | ICD-10-CM | POA: Diagnosis not present

## 2019-10-21 DIAGNOSIS — I10 Essential (primary) hypertension: Secondary | ICD-10-CM | POA: Diagnosis not present

## 2019-10-21 DIAGNOSIS — I2510 Atherosclerotic heart disease of native coronary artery without angina pectoris: Secondary | ICD-10-CM | POA: Diagnosis not present

## 2019-10-25 ENCOUNTER — Ambulatory Visit: Payer: Medicare Other | Admitting: Urology

## 2019-11-01 ENCOUNTER — Ambulatory Visit: Payer: Medicare Other | Admitting: Urology

## 2019-11-04 ENCOUNTER — Other Ambulatory Visit: Payer: Self-pay | Admitting: Urology

## 2019-11-05 LAB — PSA: Prostate Specific Ag, Serum: 27.2 ng/mL — ABNORMAL HIGH (ref 0.0–4.0)

## 2019-11-06 ENCOUNTER — Other Ambulatory Visit: Payer: Self-pay | Admitting: Urology

## 2019-11-14 NOTE — Progress Notes (Signed)
Subjective:  1. Elevated PSA   2. BPH with urinary obstruction   3. Nocturia        11/15/19: Henry Allen returns today in f/u for his history of an elevated PSA that is up to 27.2 from 24.8 6 months ago.  His prior high was 30.  He is voiding well with an IPSS of 6.   He has had no hematuria or dysuria.  He has had no weight loss or bone pain.   He has routinely declined biopsy.     IPSS    Row Name 11/15/19 1400         International Prostate Symptom Score   How often have you had the sensation of not emptying your bladder? Not at All     How often have you had to urinate less than every two hours? Less than 1 in 5 times     How often have you found you stopped and started again several times when you urinated? Not at All     How often have you found it difficult to postpone urination? Less than half the time     How often have you had a weak urinary stream? Less than half the time     How often have you had to strain to start urination? Not at All     How many times did you typically get up at night to urinate? 1 Time     Total IPSS Score 6       Quality of Life due to urinary symptoms   If you were to spend the rest of your life with your urinary condition just the way it is now how would you feel about that? Mixed             ROS:  ROS:  A complete review of systems was performed.  All systems are negative except for pertinent findings as noted.   ROS  Allergies  Allergen Reactions  . Morphine Other (See Comments)    hallucinate    Outpatient Encounter Medications as of 11/15/2019  Medication Sig  . amLODipine (NORVASC) 10 MG tablet Take 10 mg by mouth daily.  Marland Kitchen amLODipine (NORVASC) 10 MG tablet Take by mouth.  Marland Kitchen aspirin 81 MG tablet Take 81 mg by mouth daily.  . calcium-vitamin D (OSCAL WITH D) 500-200 MG-UNIT tablet Take 1 tablet by mouth.  . Flaxseed, Linseed, (FLAX SEED OIL) 1300 MG CAPS Take by mouth.  . labetalol (NORMODYNE) 200 MG tablet Take 500 mg by  mouth 2 (two) times daily.  Marland Kitchen lisinopril (PRINIVIL,ZESTRIL) 40 MG tablet Take 40 mg by mouth 2 (two) times daily.  . metoprolol tartrate (LOPRESSOR) 25 MG tablet Take 25 mg by mouth 2 (two) times daily.   No facility-administered encounter medications on file as of 11/15/2019.    Past Medical History:  Diagnosis Date  . Abdominal aortic aneurysm ([place]) 05/06/2008   7cm Infrarenal abdominal aortic aneurysm and bilateral common iliac aneurysms using a 20x10 bifurcated graft  . CAD (coronary artery disease) 2009   CABG   . Elevated PSA   . 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 Hyperlipemia   . Hypertension   . Renal artery stenosis ([facility])    he has minor atherosclerosis in the right renal artery but a 60-99% stenosis in the left renal artery.  . Stroke ([place])   . Ventral hernia     Past Surgical History:  Procedure Laterality Date  . CARDIAC CATHETERIZATION  showed severe three-vessel coronary disease  . CORONARY ARTERY BYPASS GRAFT  11/02/2007   x4 by Dr Gilford Raid    Social History   Socioeconomic History  . Marital status: Married    Spouse name: Not on file  . Number of children: Not on file  . Years of education: Not on file  . Highest education level: Not on file  Occupational History  . Not on file  Tobacco Use  . Smoking status: Former Smoker    Packs/day: 1.00    Types: Cigarettes  . Smokeless tobacco: Never Used  Substance and Sexual Activity  . Alcohol use: Not on file  . Drug use: Not on file  . Sexual activity: Not on file  Other Topics Concern  . Not on file  Social History Narrative  . Not on file   Social Determinants of Health   Financial Resource Strain:   . Difficulty of Paying Living Expenses:   Food Insecurity:   . Worried About Charity fundraiser in the Last Year:   . Arboriculturist in the Last Year:   Transportation Needs:   . Film/video editor (Medical):   Marland Kitchen Lack of Transportation (Non-Medical):   Physical Activity:   . Days of  Exercise per Week:   . Minutes of Exercise per Session:   Stress:   . Feeling of Stress :   Social Connections:   . Frequency of Communication with Friends and Family:   . Frequency of Social Gatherings with Friends and Family:   . Attends Religious Services:   . Active Member of Clubs or Organizations:   . Attends Archivist Meetings:   Marland Kitchen Marital Status:   Intimate Partner Violence:   . Fear of Current or Ex-Partner:   . Emotionally Abused:   Marland Kitchen Physically Abused:   . Sexually Abused:     Family History  Problem Relation Age of Onset  . Heart attack Mother 38  . Heart attack Father 54  . Heart attack Brother        x2 bother at age 50       Objective: Vitals:   11/15/19 1411  BP: 132/68  Pulse: (!) 47  Temp: 97.9 F (36.6 C)     Physical Exam  Lab Results:  PSA reviewed.    Studies/Results: No results found.    Assessment & Plan: Elevated PSA.  His level is back up some.  He has routinely declined biopsy but has no worrisome symptoms.  BPH with BOO.  He has mild LUTS and doesn't need treatment.    No orders of the defined types were placed in this encounter.    Orders Placed This Encounter  Procedures  . PSA    Standing Status:   Future    Standing Expiration Date:   11/14/2020      Return in about 6 months (around 05/16/2020) for with PSA.   CC: Redmond School, MD      Irine Seal 11/15/2019

## 2019-11-15 ENCOUNTER — Ambulatory Visit: Payer: Medicare Other | Admitting: Urology

## 2019-11-15 ENCOUNTER — Other Ambulatory Visit: Payer: Self-pay

## 2019-11-15 ENCOUNTER — Ambulatory Visit (INDEPENDENT_AMBULATORY_CARE_PROVIDER_SITE_OTHER): Payer: Medicare Other | Admitting: Urology

## 2019-11-15 ENCOUNTER — Encounter: Payer: Self-pay | Admitting: Urology

## 2019-11-15 VITALS — BP 132/68 | HR 47 | Temp 97.9°F | Ht 68.5 in | Wt 197.0 lb

## 2019-11-15 DIAGNOSIS — N138 Other obstructive and reflux uropathy: Secondary | ICD-10-CM

## 2019-11-15 DIAGNOSIS — N401 Enlarged prostate with lower urinary tract symptoms: Secondary | ICD-10-CM

## 2019-11-15 DIAGNOSIS — R972 Elevated prostate specific antigen [PSA]: Secondary | ICD-10-CM | POA: Diagnosis not present

## 2019-11-15 DIAGNOSIS — R351 Nocturia: Secondary | ICD-10-CM

## 2019-11-15 NOTE — Progress Notes (Signed)
Urological Symptom Review  Patient is experiencing the following symptoms: Frequent urination Get up at night to urinate Erection problems (male only)   Review of Systems  Gastrointestinal (upper)  : Negative for upper GI symptoms  Gastrointestinal (lower) : Negative for lower GI symptoms  Constitutional : Negative for symptoms  Skin: Negative for skin symptoms  Eyes: Negative for eye symptoms  Ear/Nose/Throat : Negative for Ear/Nose/Throat symptoms  Hematologic/Lymphatic: Easy bruising  Cardiovascular : Negative for cardiovascular symptoms  Respiratory : Negative for respiratory symptoms  Endocrine: Negative for endocrine symptoms  Musculoskeletal: Negative for musculoskeletal symptoms  Neurological: Negative for neurological symptoms  Psychologic: Negative for psychiatric symptoms

## 2020-01-18 ENCOUNTER — Emergency Department (HOSPITAL_COMMUNITY): Payer: Medicare Other

## 2020-01-18 ENCOUNTER — Inpatient Hospital Stay (HOSPITAL_COMMUNITY)
Admission: EM | Admit: 2020-01-18 | Discharge: 2020-01-24 | DRG: 177 | Disposition: A | Discharge status: Skilled Nursing Facility | Payer: Medicare Other | Attending: Family Medicine | Admitting: Family Medicine

## 2020-01-18 ENCOUNTER — Encounter (HOSPITAL_COMMUNITY): Payer: Self-pay | Admitting: *Deleted

## 2020-01-18 ENCOUNTER — Other Ambulatory Visit: Payer: Self-pay

## 2020-01-18 DIAGNOSIS — R0902 Hypoxemia: Secondary | ICD-10-CM | POA: Diagnosis not present

## 2020-01-18 DIAGNOSIS — E785 Hyperlipidemia, unspecified: Secondary | ICD-10-CM | POA: Diagnosis present

## 2020-01-18 DIAGNOSIS — Z7982 Long term (current) use of aspirin: Secondary | ICD-10-CM | POA: Diagnosis not present

## 2020-01-18 DIAGNOSIS — F0150 Vascular dementia without behavioral disturbance: Secondary | ICD-10-CM | POA: Diagnosis present

## 2020-01-18 DIAGNOSIS — S0121XA Laceration without foreign body of nose, initial encounter: Secondary | ICD-10-CM | POA: Diagnosis not present

## 2020-01-18 DIAGNOSIS — M47812 Spondylosis without myelopathy or radiculopathy, cervical region: Secondary | ICD-10-CM | POA: Diagnosis not present

## 2020-01-18 DIAGNOSIS — G9340 Encephalopathy, unspecified: Secondary | ICD-10-CM

## 2020-01-18 DIAGNOSIS — Z79899 Other long term (current) drug therapy: Secondary | ICD-10-CM

## 2020-01-18 DIAGNOSIS — E876 Hypokalemia: Secondary | ICD-10-CM | POA: Diagnosis present

## 2020-01-18 DIAGNOSIS — Z66 Do not resuscitate: Secondary | ICD-10-CM | POA: Diagnosis not present

## 2020-01-18 DIAGNOSIS — R778 Other specified abnormalities of plasma proteins: Secondary | ICD-10-CM | POA: Diagnosis present

## 2020-01-18 DIAGNOSIS — Z87891 Personal history of nicotine dependence: Secondary | ICD-10-CM

## 2020-01-18 DIAGNOSIS — S0993XA Unspecified injury of face, initial encounter: Secondary | ICD-10-CM | POA: Diagnosis not present

## 2020-01-18 DIAGNOSIS — Z885 Allergy status to narcotic agent status: Secondary | ICD-10-CM | POA: Diagnosis not present

## 2020-01-18 DIAGNOSIS — R0602 Shortness of breath: Secondary | ICD-10-CM | POA: Diagnosis not present

## 2020-01-18 DIAGNOSIS — N1831 Chronic kidney disease, stage 3a: Secondary | ICD-10-CM | POA: Diagnosis present

## 2020-01-18 DIAGNOSIS — I129 Hypertensive chronic kidney disease with stage 1 through stage 4 chronic kidney disease, or unspecified chronic kidney disease: Secondary | ICD-10-CM | POA: Diagnosis not present

## 2020-01-18 DIAGNOSIS — J1282 Pneumonia due to coronavirus disease 2019: Secondary | ICD-10-CM | POA: Diagnosis not present

## 2020-01-18 DIAGNOSIS — S51811A Laceration without foreign body of right forearm, initial encounter: Secondary | ICD-10-CM | POA: Diagnosis not present

## 2020-01-18 DIAGNOSIS — W19XXXA Unspecified fall, initial encounter: Secondary | ICD-10-CM | POA: Diagnosis not present

## 2020-01-18 DIAGNOSIS — U071 COVID-19: Principal | ICD-10-CM | POA: Diagnosis present

## 2020-01-18 DIAGNOSIS — Z951 Presence of aortocoronary bypass graft: Secondary | ICD-10-CM | POA: Diagnosis not present

## 2020-01-18 DIAGNOSIS — Z720 Tobacco use: Secondary | ICD-10-CM | POA: Diagnosis present

## 2020-01-18 DIAGNOSIS — Z8249 Family history of ischemic heart disease and other diseases of the circulatory system: Secondary | ICD-10-CM

## 2020-01-18 DIAGNOSIS — R001 Bradycardia, unspecified: Secondary | ICD-10-CM | POA: Diagnosis not present

## 2020-01-18 DIAGNOSIS — J439 Emphysema, unspecified: Secondary | ICD-10-CM | POA: Diagnosis not present

## 2020-01-18 DIAGNOSIS — I10 Essential (primary) hypertension: Secondary | ICD-10-CM | POA: Diagnosis present

## 2020-01-18 DIAGNOSIS — S199XXA Unspecified injury of neck, initial encounter: Secondary | ICD-10-CM | POA: Diagnosis not present

## 2020-01-18 DIAGNOSIS — N183 Chronic kidney disease, stage 3 unspecified: Secondary | ICD-10-CM | POA: Diagnosis present

## 2020-01-18 DIAGNOSIS — I700 Atherosclerosis of aorta: Secondary | ICD-10-CM | POA: Diagnosis present

## 2020-01-18 DIAGNOSIS — I701 Atherosclerosis of renal artery: Secondary | ICD-10-CM | POA: Diagnosis present

## 2020-01-18 DIAGNOSIS — Z8673 Personal history of transient ischemic attack (TIA), and cerebral infarction without residual deficits: Secondary | ICD-10-CM | POA: Diagnosis not present

## 2020-01-18 DIAGNOSIS — R4182 Altered mental status, unspecified: Secondary | ICD-10-CM | POA: Diagnosis not present

## 2020-01-18 DIAGNOSIS — S59911A Unspecified injury of right forearm, initial encounter: Secondary | ICD-10-CM | POA: Diagnosis not present

## 2020-01-18 DIAGNOSIS — R55 Syncope and collapse: Secondary | ICD-10-CM | POA: Diagnosis not present

## 2020-01-18 DIAGNOSIS — K439 Ventral hernia without obstruction or gangrene: Secondary | ICD-10-CM | POA: Diagnosis not present

## 2020-01-18 DIAGNOSIS — I6529 Occlusion and stenosis of unspecified carotid artery: Secondary | ICD-10-CM | POA: Diagnosis not present

## 2020-01-18 DIAGNOSIS — Z8679 Personal history of other diseases of the circulatory system: Secondary | ICD-10-CM

## 2020-01-18 DIAGNOSIS — I2510 Atherosclerotic heart disease of native coronary artery without angina pectoris: Secondary | ICD-10-CM | POA: Diagnosis not present

## 2020-01-18 HISTORY — DX: Vascular dementia, unspecified severity, without behavioral disturbance, psychotic disturbance, mood disturbance, and anxiety: F01.50

## 2020-01-18 LAB — CBC WITH DIFFERENTIAL/PLATELET
Abs Immature Granulocytes: 0.14 10*3/uL — ABNORMAL HIGH (ref 0.00–0.07)
Basophils Absolute: 0.1 10*3/uL (ref 0.0–0.1)
Basophils Relative: 1 %
Eosinophils Absolute: 0 10*3/uL (ref 0.0–0.5)
Eosinophils Relative: 0 %
HCT: 42.9 % (ref 39.0–52.0)
Hemoglobin: 13.7 g/dL (ref 13.0–17.0)
Immature Granulocytes: 2 %
Lymphocytes Relative: 9 %
Lymphs Abs: 0.7 10*3/uL (ref 0.7–4.0)
MCH: 28.1 pg (ref 26.0–34.0)
MCHC: 31.9 g/dL (ref 30.0–36.0)
MCV: 88.1 fL (ref 80.0–100.0)
Monocytes Absolute: 1.1 10*3/uL — ABNORMAL HIGH (ref 0.1–1.0)
Monocytes Relative: 14 %
Neutro Abs: 5.9 10*3/uL (ref 1.7–7.7)
Neutrophils Relative %: 74 %
Platelets: 171 10*3/uL (ref 150–400)
RBC: 4.87 MIL/uL (ref 4.22–5.81)
RDW: 14.1 % (ref 11.5–15.5)
WBC: 7.9 10*3/uL (ref 4.0–10.5)
nRBC: 0 % (ref 0.0–0.2)

## 2020-01-18 LAB — COMPREHENSIVE METABOLIC PANEL
ALT: 16 U/L (ref 0–44)
AST: 22 U/L (ref 15–41)
Albumin: 3.4 g/dL — ABNORMAL LOW (ref 3.5–5.0)
Alkaline Phosphatase: 53 U/L (ref 38–126)
Anion gap: 11 (ref 5–15)
BUN: 24 mg/dL — ABNORMAL HIGH (ref 8–23)
CO2: 26 mmol/L (ref 22–32)
Calcium: 8.3 mg/dL — ABNORMAL LOW (ref 8.9–10.3)
Chloride: 102 mmol/L (ref 98–111)
Creatinine, Ser: 1.38 mg/dL — ABNORMAL HIGH (ref 0.61–1.24)
GFR calc Af Amer: 56 mL/min — ABNORMAL LOW (ref >60)
GFR calc non Af Amer: 48 mL/min — ABNORMAL LOW (ref >60)
Glucose, Bld: 135 mg/dL — ABNORMAL HIGH (ref 70–99)
Potassium: 2.9 mmol/L — ABNORMAL LOW (ref 3.5–5.1)
Sodium: 139 mmol/L (ref 135–145)
Total Bilirubin: 1.3 mg/dL — ABNORMAL HIGH (ref 0.3–1.2)
Total Protein: 6.7 g/dL (ref 6.5–8.1)

## 2020-01-18 LAB — BLOOD GAS, ARTERIAL
Acid-Base Excess: 4 mmol/L — ABNORMAL HIGH (ref 0.0–2.0)
Bicarbonate: 27.8 mmol/L (ref 20.0–28.0)
Drawn by: 22223
FIO2: 28
O2 Saturation: 95.1 %
Patient temperature: 37
pCO2 arterial: 42.4 mmHg (ref 32.0–48.0)
pH, Arterial: 7.436 (ref 7.350–7.450)
pO2, Arterial: 74 mmHg — ABNORMAL LOW (ref 83.0–108.0)

## 2020-01-18 LAB — TROPONIN I (HIGH SENSITIVITY): Troponin I (High Sensitivity): 74 ng/L — ABNORMAL HIGH (ref <18)

## 2020-01-18 LAB — ETHANOL: Alcohol, Ethyl (B): <10 mg/dL (ref <10)

## 2020-01-18 LAB — AMMONIA: Ammonia: 9 umol/L (ref 9–35)

## 2020-01-18 MED ORDER — SODIUM CHLORIDE 0.9 % IV BOLUS
1000.0000 mL | Freq: Once | INTRAVENOUS | Status: AC
  Administered 2020-01-18: 1000 mL via INTRAVENOUS

## 2020-01-18 NOTE — ED Triage Notes (Signed)
Pt is poor historian and unable to state why he is here.  Blood noted to nose.

## 2020-01-18 NOTE — ED Provider Notes (Signed)
[facility] EMERGENCY DEPARTMENT Provider Note   CSN: [id number] Arrival date & time: 01/18/20  2015     History Chief Complaint  Patient presents with  . Altered Mental Status    Henry Allen is a 79 y.o. male.  HPI   This patient is a 79 year old male, he has a history of an abdominal aortic aneurysm status post repair, coronary disease status post bypass grafting, history of a hemorrhagic stroke, hypertension hyperlipidemia and renal artery stenosis.  He presents to the hospital today for an unknown reason.  He arrives by paramedic transport, he had blood on his nose and states he injured his right arm when he was trying to get into it, when I ask him what he means by get into it he says you know get inside, he does not give any further clarification.  He seems to be confused, he does not have any specific complaints of pain other than his right arm in his nose.  He denies chest pain shortness of breath nausea vomiting diarrhea but states he has difficulty with balance.  Endorses smoking and states he used to drink many years ago.  He knows that he has had heart disease and an abdominal surgery but does not have any complaints of any of these areas.  He really does not know why he is here.  Discussed with daughter.  Vicki at phone number [phone].  She is a Marine scientist and communicates with the rest of the family.  She reports that his baseline is that he is able to take care of himself, he walks and talks but has had some progressive mild confusion, tonight he was found in bed by a family member who was called to come help him.  When they got there he had blood on him, there was some kind of fall, no be sought and he does not have the ability to say what happened.  He is not able to care for himself this evening.  Past Medical History:  Diagnosis Date  . Abdominal aortic aneurysm ([place]) 05/06/2008   7cm Infrarenal abdominal aortic aneurysm and bilateral common iliac aneurysms using a  20x10 bifurcated graft  . CAD (coronary artery disease) 2009   CABG   . Elevated PSA   . Hemorrhagic stroke ([place]) 1999  . Hyperlipemia   . Hypertension   . Renal artery stenosis ([facility])    he has minor atherosclerosis in the right renal artery but a 60-99% stenosis in the left renal artery.  . Stroke ([place])   . Ventral hernia     There are no problems to display for this patient.   Past Surgical History:  Procedure Laterality Date  . CARDIAC CATHETERIZATION     showed severe three-vessel coronary disease  . CORONARY ARTERY BYPASS GRAFT  11/02/2007   x4 by Dr Gilford Raid       Family History  Problem Relation Age of Onset  . Heart attack Mother 61  . Heart attack Father 55  . Heart attack Brother        x2 bother at age 62    Social History   Tobacco Use  . Smoking status: Former Smoker    Packs/day: 1.00    Types: Cigarettes  . Smokeless tobacco: Never Used  Substance Use Topics  . Alcohol use: Not on file  . Drug use: Not on file    Home Medications Prior to Admission medications   Medication Sig Start Date End Date Taking? Authorizing Provider  amLODipine (NORVASC) 10 MG tablet Take 10 mg by mouth daily.    [provider]  amLODipine (NORVASC) 10 MG tablet Take by mouth.    [provider]  aspirin 81 MG tablet Take 81 mg by mouth daily.    [provider]  calcium-vitamin D (OSCAL WITH D) 500-200 MG-UNIT tablet Take 1 tablet by mouth.    [provider]  Flaxseed, Linseed, (FLAX SEED OIL) 1300 MG CAPS Take by mouth.    [provider]  labetalol (NORMODYNE) 200 MG tablet Take 500 mg by mouth 2 (two) times daily.    [provider]  lisinopril (PRINIVIL,ZESTRIL) 40 MG tablet Take 40 mg by mouth 2 (two) times daily.    [provider]  metoprolol tartrate (LOPRESSOR) 25 MG tablet Take 25 mg by mouth 2 (two) times daily.    [provider]    Allergies    Morphine  Review of Systems     Review of Systems  Unable to perform ROS: Mental status change     Physical Exam Updated Vital Signs BP (!) 149/68   Pulse (!) 52   Temp 97.7 F (36.5 C) (Oral)   Resp 19   Ht 1.74 m (5' 8.5")   Wt 84.8 kg   SpO2 97%   BMI 28.02 kg/m   Physical Exam Vitals and nursing note reviewed.  Constitutional:      General: He is not in acute distress.    Appearance: He is well-developed.  HENT:     Head: Normocephalic.     Mouth/Throat:     Pharynx: No oropharyngeal exudate.  Eyes:     General: No scleral icterus.       Right eye: No discharge.        Left eye: No discharge.     Conjunctiva/sclera: Conjunctivae normal.     Pupils: Pupils are equal, round, and reactive to light.  Neck:     Thyroid: No thyromegaly.     Vascular: No JVD.  Cardiovascular:     Rate and Rhythm: Regular rhythm. Bradycardia present.     Heart sounds: Normal heart sounds. No murmur heard.  No friction rub. No gallop.   Pulmonary:     Effort: Pulmonary effort is normal. No respiratory distress.     Breath sounds: Normal breath sounds. No wheezing or rales.  Abdominal:     General: Bowel sounds are normal. There is no distension.     Palpations: Abdomen is soft. There is no mass.     Tenderness: There is no abdominal tenderness.  Musculoskeletal:        General: No tenderness. Normal range of motion.     Cervical back: Normal range of motion and neck supple.  Lymphadenopathy:     Cervical: No cervical adenopathy.  Skin:    General: Skin is warm and dry.     Findings: No erythema or rash.     Comments: Skin tears to the right forearm, laceration over the nasal bridge  Neurological:     Mental Status: He is alert.     Coordination: Coordination normal.     Comments: Follows commands but generally weak, needs assistance sitting up, confused but pleasant  Psychiatric:        Behavior: Behavior normal.     ED Results / Procedures / Treatments   Labs (all labs ordered are listed, but only  abnormal results are displayed) Labs Reviewed  CBC WITH DIFFERENTIAL/PLATELET - Abnormal; Notable for the  following components:      Result Value   Monocytes Absolute 1.1 (*)    Abs Immature Granulocytes 0.14 (*)    All other components within normal limits  COMPREHENSIVE METABOLIC PANEL - Abnormal; Notable for the following components:   Potassium 2.9 (*)    Glucose, Bld 135 (*)    BUN 24 (*)    Creatinine, Ser 1.38 (*)    Calcium 8.3 (*)    Albumin 3.4 (*)    Total Bilirubin 1.3 (*)    GFR calc non Af Amer 48 (*)    GFR calc Af Amer 56 (*)    All other components within normal limits  BLOOD GAS, ARTERIAL - Abnormal; Notable for the following components:   pO2, Arterial 74.0 (*)    Acid-Base Excess 4.0 (*)    All other components within normal limits  TROPONIN I (HIGH SENSITIVITY) - Abnormal; Notable for the following components:   Troponin I (High Sensitivity) 74 (*)    All other components within normal limits  AMMONIA  ETHANOL  URINALYSIS, ROUTINE W REFLEX MICROSCOPIC    EKG EKG Interpretation  Date/Time:  Saturday January 18 2020 21:09:04 EDT Ventricular Rate:  50 PR Interval:    QRS Duration: 102 QT Interval:  538 QTC Calculation: 491 R Axis:   23 Text Interpretation: Sinus rhythm LVH with secondary repolarization abnormality Anterior Q waves, possibly due to LVH Baseline wander in lead(s) V2 Confirmed by Noemi Chapel [phone]) on 01/19/2020 12:20:22 AM   Radiology CT Head Wo Contrast  Result Date: 01/18/2020 CLINICAL DATA:  Facial trauma. Altered mental status. Blood noted to nose. EXAM: CT HEAD WITHOUT CONTRAST TECHNIQUE: Contiguous axial images were obtained from the base of the skull through the vertex without intravenous contrast. COMPARISON:  None. FINDINGS: Brain: No hemorrhage or evidence of acute ischemia. Generalized atrophy with moderate chronic small vessel ischemia. Encephalomalacia in the high left frontal lobe consistent with remote infarct. Remote  lacunar infarct in the left caudate and basal ganglia. Slight asymmetry of the lateral ventricles with left larger than right, possible ex vacuo dilatation. No hydrocephalus, the basilar cisterns are patent. There is no subdural or extra-axial collection. No concerning intraabdominal mass effect. Vascular: Vascular calcifications at the skull base without hyperdense vessel. Skull: Probable remote left frontal burr hole. No skull fracture or focal lesion. Sinuses/Orbits: Scattered opacification of lower mastoid air cells. Detailed evaluation on concurrent face CT, reported separately. Other: None. IMPRESSION: 1. No acute intracranial abnormality. No skull fracture. 2. Remote left frontal infarct. Remote lacunar infarcts in the left caudate and basal ganglia. Electronically Signed   By: Keith Rake M.D.   On: 01/18/2020 23:30   CT Cervical Spine Wo Contrast  Result Date: 01/18/2020 CLINICAL DATA:  Facial trauma.  Altered mental status. EXAM: CT CERVICAL SPINE WITHOUT CONTRAST TECHNIQUE: Multidetector CT imaging of the cervical spine was performed without intravenous contrast. Multiplanar CT image reconstructions were also generated. COMPARISON:  None. FINDINGS: Alignment: Minimal anterolisthesis of C7 on T1, likely degenerative. No traumatic subluxation. No jumped or perched facets. C1 well aligned on C2. Skull base and vertebrae: No acute fracture. Vertebral body heights are maintained. The dens and skull base are intact. Soft tissues and spinal canal: No prevertebral fluid or swelling. No visible canal hematoma. Disc levels: Diffuse endplate spurring. There is disc space narrowing at C4-C5, C5-C6 and C6-C7. Disc calcification at C4-C5. Multilevel facet hypertrophy, most prominently involving C4-C5 on the right. Multilevel neural foraminal stenosis. No bony canal narrowing. Upper  chest: Emphysema with biapical pleuroparenchymal scarring. No acute findings. Other: Carotid calcifications. IMPRESSION:  Multilevel degenerative change in the cervical spine without acute fracture or subluxation. Electronically Signed   By: Keith Rake M.D.   On: 01/18/2020 23:37   DG Chest Port 1 View  Result Date: 01/18/2020 CLINICAL DATA:  Shortness of breath.  Altered mental status. EXAM: PORTABLE CHEST 1 VIEW COMPARISON:  Remote radiograph 05/07/2008 FINDINGS: Patient is rotated. Post median sternotomy. Upper normal heart size. Tortuous aorta likely accentuated by rotation. Aortic atherosclerosis. Minimal subpleural reticulation at the left lung base. Mild biapical pleuroparenchymal scarring. No confluent consolidation. No pneumothorax or large pleural effusion. No acute osseous abnormalities are seen. IMPRESSION: 1. Minimal subpleural reticulation at the left lung base, favor atelectasis or scarring. 2. Upper normal heart size. Aortic tortuosity likely accentuated by rotation. Aortic Atherosclerosis (ICD10-I70.0). Electronically Signed   By: Keith Rake M.D.   On: 01/18/2020 22:51   CT Maxillofacial Wo Contrast  Result Date: 01/18/2020 CLINICAL DATA:  Facial trauma.  Blood from nose. EXAM: CT MAXILLOFACIAL WITHOUT CONTRAST TECHNIQUE: Multidetector CT imaging of the maxillofacial structures was performed. Multiplanar CT image reconstructions were also generated. COMPARISON:  None. FINDINGS: Osseous: No acute nasal bone fracture. The zygomatic arches and mandibles are intact. The temporomandibular joints are congruent. Poor dentition with predominantly missing teeth but scattered dental caries and periapical lucencies. No evidence of 2 fracture. Orbits: No orbital fracture.  No evidence of globe injury. Sinuses: Diffuse paranasal sinus mucosal thickening. Opacification of ethmoid air cells with mucosal thickening of the frontal maxillary sinuses. No sinus fracture or fluid level. Scattered opacification of bilateral mastoid air cells. Soft tissues: No confluent soft tissue hematoma. No soft tissue air. Limited  intracranial: Assessed on concurrent head CT, reported separately. IMPRESSION: 1. No facial bone fracture. 2. Poor dentition with predominantly missing teeth but scattered dental caries and periapical lucencies. 3. Diffuse paranasal sinus mucosal thickening. No sinus fracture or fluid level. Electronically Signed   By: Keith Rake M.D.   On: 01/18/2020 23:33    Procedures Procedures (including critical care time)  Medications Ordered in ED Medications  sodium chloride 0.9 % bolus 1,000 mL (1,000 mLs Intravenous New Bag/Given 01/18/20 2239)    ED Course  I have reviewed the triage vital signs and the nursing notes.  Pertinent labs & imaging results that were available during my care of the patient were reviewed by me and considered in my medical decision making (see chart for details).    MDM Rules/Calculators/A&P                          This patient has had some progressive confusion, he is mildly bradycardic but not hypotensive or febrile, his labs are fairly unremarkable except for a potassium of 2.9 which will need to be replaced, due to his increasing weakness and confusion he will need to be admitted to the hospital, at change of shift oncoming physician to page admitting doctors to arrange this.  At this time the patient will get some replacement potassium, the CT scans were reviewed with the patient's family member over the phone, they are aware that there is no other significant signs of trauma.  Final Clinical Impression(s) / ED Diagnoses Final diagnoses:  None    Rx / DC Orders ED Discharge Orders    None       Noemi Chapel, MD 01/19/20 [phone]

## 2020-01-18 NOTE — ED Notes (Signed)
Pt to CT

## 2020-01-19 ENCOUNTER — Other Ambulatory Visit: Payer: Self-pay

## 2020-01-19 ENCOUNTER — Emergency Department (HOSPITAL_COMMUNITY): Payer: Medicare Other

## 2020-01-19 ENCOUNTER — Encounter (HOSPITAL_COMMUNITY): Payer: Self-pay | Admitting: Internal Medicine

## 2020-01-19 DIAGNOSIS — Z87891 Personal history of nicotine dependence: Secondary | ICD-10-CM | POA: Diagnosis not present

## 2020-01-19 DIAGNOSIS — N183 Chronic kidney disease, stage 3 unspecified: Secondary | ICD-10-CM | POA: Diagnosis present

## 2020-01-19 DIAGNOSIS — Z885 Allergy status to narcotic agent status: Secondary | ICD-10-CM | POA: Diagnosis not present

## 2020-01-19 DIAGNOSIS — E876 Hypokalemia: Secondary | ICD-10-CM | POA: Diagnosis not present

## 2020-01-19 DIAGNOSIS — Z951 Presence of aortocoronary bypass graft: Secondary | ICD-10-CM | POA: Diagnosis not present

## 2020-01-19 DIAGNOSIS — I10 Essential (primary) hypertension: Secondary | ICD-10-CM | POA: Diagnosis present

## 2020-01-19 DIAGNOSIS — Z8249 Family history of ischemic heart disease and other diseases of the circulatory system: Secondary | ICD-10-CM | POA: Diagnosis not present

## 2020-01-19 DIAGNOSIS — I700 Atherosclerosis of aorta: Secondary | ICD-10-CM | POA: Diagnosis present

## 2020-01-19 DIAGNOSIS — E785 Hyperlipidemia, unspecified: Secondary | ICD-10-CM | POA: Diagnosis present

## 2020-01-19 DIAGNOSIS — F0150 Vascular dementia without behavioral disturbance: Secondary | ICD-10-CM | POA: Diagnosis present

## 2020-01-19 DIAGNOSIS — Z7982 Long term (current) use of aspirin: Secondary | ICD-10-CM | POA: Diagnosis not present

## 2020-01-19 DIAGNOSIS — Z8679 Personal history of other diseases of the circulatory system: Secondary | ICD-10-CM | POA: Diagnosis not present

## 2020-01-19 DIAGNOSIS — Z8673 Personal history of transient ischemic attack (TIA), and cerebral infarction without residual deficits: Secondary | ICD-10-CM | POA: Diagnosis not present

## 2020-01-19 DIAGNOSIS — I701 Atherosclerosis of renal artery: Secondary | ICD-10-CM | POA: Diagnosis present

## 2020-01-19 DIAGNOSIS — J1282 Pneumonia due to coronavirus disease 2019: Secondary | ICD-10-CM | POA: Diagnosis present

## 2020-01-19 DIAGNOSIS — R001 Bradycardia, unspecified: Secondary | ICD-10-CM | POA: Diagnosis present

## 2020-01-19 DIAGNOSIS — N1831 Chronic kidney disease, stage 3a: Secondary | ICD-10-CM | POA: Diagnosis present

## 2020-01-19 DIAGNOSIS — R778 Other specified abnormalities of plasma proteins: Secondary | ICD-10-CM | POA: Diagnosis present

## 2020-01-19 DIAGNOSIS — R55 Syncope and collapse: Secondary | ICD-10-CM | POA: Diagnosis present

## 2020-01-19 DIAGNOSIS — Z720 Tobacco use: Secondary | ICD-10-CM | POA: Diagnosis present

## 2020-01-19 DIAGNOSIS — S0121XA Laceration without foreign body of nose, initial encounter: Secondary | ICD-10-CM | POA: Diagnosis present

## 2020-01-19 DIAGNOSIS — S51811A Laceration without foreign body of right forearm, initial encounter: Secondary | ICD-10-CM | POA: Diagnosis present

## 2020-01-19 DIAGNOSIS — R4182 Altered mental status, unspecified: Secondary | ICD-10-CM | POA: Diagnosis not present

## 2020-01-19 DIAGNOSIS — K439 Ventral hernia without obstruction or gangrene: Secondary | ICD-10-CM | POA: Diagnosis present

## 2020-01-19 DIAGNOSIS — R2681 Unsteadiness on feet: Secondary | ICD-10-CM | POA: Diagnosis not present

## 2020-01-19 DIAGNOSIS — M6281 Muscle weakness (generalized): Secondary | ICD-10-CM | POA: Diagnosis not present

## 2020-01-19 DIAGNOSIS — R1312 Dysphagia, oropharyngeal phase: Secondary | ICD-10-CM | POA: Diagnosis not present

## 2020-01-19 DIAGNOSIS — U071 COVID-19: Secondary | ICD-10-CM | POA: Diagnosis present

## 2020-01-19 DIAGNOSIS — R6889 Other general symptoms and signs: Secondary | ICD-10-CM | POA: Diagnosis not present

## 2020-01-19 DIAGNOSIS — Z7401 Bed confinement status: Secondary | ICD-10-CM | POA: Diagnosis not present

## 2020-01-19 DIAGNOSIS — Z79899 Other long term (current) drug therapy: Secondary | ICD-10-CM | POA: Diagnosis not present

## 2020-01-19 DIAGNOSIS — R2689 Other abnormalities of gait and mobility: Secondary | ICD-10-CM | POA: Diagnosis not present

## 2020-01-19 DIAGNOSIS — I2510 Atherosclerotic heart disease of native coronary artery without angina pectoris: Secondary | ICD-10-CM | POA: Diagnosis present

## 2020-01-19 DIAGNOSIS — Z743 Need for continuous supervision: Secondary | ICD-10-CM | POA: Diagnosis not present

## 2020-01-19 DIAGNOSIS — M6259 Muscle wasting and atrophy, not elsewhere classified, multiple sites: Secondary | ICD-10-CM | POA: Diagnosis not present

## 2020-01-19 DIAGNOSIS — Z66 Do not resuscitate: Secondary | ICD-10-CM | POA: Diagnosis not present

## 2020-01-19 DIAGNOSIS — I129 Hypertensive chronic kidney disease with stage 1 through stage 4 chronic kidney disease, or unspecified chronic kidney disease: Secondary | ICD-10-CM | POA: Diagnosis present

## 2020-01-19 DIAGNOSIS — S59911A Unspecified injury of right forearm, initial encounter: Secondary | ICD-10-CM | POA: Diagnosis not present

## 2020-01-19 LAB — FIBRINOGEN: Fibrinogen: 640 mg/dL — ABNORMAL HIGH (ref 210–475)

## 2020-01-19 LAB — URINALYSIS, ROUTINE W REFLEX MICROSCOPIC
Bilirubin Urine: NEGATIVE
Glucose, UA: NEGATIVE mg/dL
Hgb urine dipstick: NEGATIVE
Ketones, ur: 5 mg/dL — AB
Leukocytes,Ua: NEGATIVE
Nitrite: NEGATIVE
Protein, ur: 100 mg/dL — AB
Specific Gravity, Urine: 1.019 (ref 1.005–1.030)
pH: 6 (ref 5.0–8.0)

## 2020-01-19 LAB — BRAIN NATRIURETIC PEPTIDE: B Natriuretic Peptide: 627 pg/mL — ABNORMAL HIGH (ref 0.0–100.0)

## 2020-01-19 LAB — PROCALCITONIN: Procalcitonin: <0.1 ng/mL

## 2020-01-19 LAB — D-DIMER, QUANTITATIVE: D-Dimer, Quant: 6.14 ug/mL-FEU — ABNORMAL HIGH (ref 0.00–0.50)

## 2020-01-19 LAB — FERRITIN: Ferritin: 463 ng/mL — ABNORMAL HIGH (ref 24–336)

## 2020-01-19 LAB — SARS CORONAVIRUS 2 BY RT PCR (HOSPITAL ORDER, PERFORMED IN ~~LOC~~ HOSPITAL LAB): SARS Coronavirus 2: POSITIVE — AB

## 2020-01-19 LAB — C-REACTIVE PROTEIN: CRP: 16.6 mg/dL — ABNORMAL HIGH (ref <1.0)

## 2020-01-19 LAB — LACTATE DEHYDROGENASE: LDH: 179 U/L (ref 98–192)

## 2020-01-19 LAB — TROPONIN I (HIGH SENSITIVITY): Troponin I (High Sensitivity): 72 ng/L — ABNORMAL HIGH (ref <18)

## 2020-01-19 MED ORDER — ACETAMINOPHEN 650 MG RE SUPP: 650.0000 mg | Freq: Four times a day (QID) | RECTAL | Status: DC | PRN

## 2020-01-19 MED ORDER — AMLODIPINE BESYLATE 5 MG PO TABS
10.0000 mg | ORAL_TABLET | Freq: Every day | ORAL | Status: DC
  Administered 2020-01-19 – 2020-01-24 (×6): 10 mg via ORAL
  Filled 2020-01-19 (×6): qty 2

## 2020-01-19 MED ORDER — ONDANSETRON HCL 4 MG/2ML IJ SOLN: 4.0000 mg | Freq: Four times a day (QID) | INTRAMUSCULAR | Status: DC | PRN

## 2020-01-19 MED ORDER — POTASSIUM CHLORIDE 10 MEQ/100ML IV SOLN
10.0000 meq | INTRAVENOUS | Status: AC
  Administered 2020-01-19 (×3): 10 meq via INTRAVENOUS
  Filled 2020-01-19 (×3): qty 100

## 2020-01-19 MED ORDER — ASCORBIC ACID 500 MG PO TABS
500.0000 mg | ORAL_TABLET | Freq: Every day | ORAL | Status: DC
  Administered 2020-01-19 – 2020-01-24 (×6): 500 mg via ORAL
  Filled 2020-01-19 (×6): qty 1

## 2020-01-19 MED ORDER — ENOXAPARIN SODIUM 40 MG/0.4ML ~~LOC~~ SOLN
40.0000 mg | SUBCUTANEOUS | Status: DC
  Administered 2020-01-19 – 2020-01-21 (×3): 40 mg via SUBCUTANEOUS
  Filled 2020-01-19 (×3): qty 0.4

## 2020-01-19 MED ORDER — ACETAMINOPHEN 325 MG PO TABS: 650.0000 mg | ORAL_TABLET | Freq: Four times a day (QID) | ORAL | Status: DC | PRN

## 2020-01-19 MED ORDER — METHYLPREDNISOLONE SODIUM SUCC 40 MG IJ SOLR
40.0000 mg | Freq: Once | INTRAMUSCULAR | Status: AC
  Administered 2020-01-19: 40 mg via INTRAVENOUS
  Filled 2020-01-19: qty 1

## 2020-01-19 MED ORDER — POTASSIUM CHLORIDE CRYS ER 20 MEQ PO TBCR
40.0000 meq | EXTENDED_RELEASE_TABLET | Freq: Once | ORAL | Status: AC
  Administered 2020-01-19: 40 meq via ORAL
  Filled 2020-01-19: qty 2

## 2020-01-19 MED ORDER — SODIUM CHLORIDE 0.9 % IV SOLN
100.0000 mg | Freq: Every day | INTRAVENOUS | Status: AC
  Administered 2020-01-20 – 2020-01-23 (×4): 100 mg via INTRAVENOUS
  Filled 2020-01-19 (×5): qty 20

## 2020-01-19 MED ORDER — SODIUM CHLORIDE 0.9 % IV SOLN: 100.0000 mg | Freq: Every day | INTRAVENOUS | Status: DC

## 2020-01-19 MED ORDER — SODIUM CHLORIDE 0.9 % IV SOLN
200.0000 mg | Freq: Once | INTRAVENOUS | Status: DC
  Filled 2020-01-19: qty 40

## 2020-01-19 MED ORDER — LISINOPRIL 10 MG PO TABS
40.0000 mg | ORAL_TABLET | Freq: Two times a day (BID) | ORAL | Status: DC
  Administered 2020-01-19 – 2020-01-24 (×11): 40 mg via ORAL
  Filled 2020-01-19 (×11): qty 4

## 2020-01-19 MED ORDER — ONDANSETRON HCL 4 MG PO TABS: 4.0000 mg | ORAL_TABLET | Freq: Four times a day (QID) | ORAL | Status: DC | PRN

## 2020-01-19 MED ORDER — METHYLPREDNISOLONE SODIUM SUCC 125 MG IJ SOLR
0.5000 mg/kg | Freq: Two times a day (BID) | INTRAMUSCULAR | Status: DC
  Administered 2020-01-19 – 2020-01-24 (×11): 42.5 mg via INTRAVENOUS
  Filled 2020-01-19 (×11): qty 2

## 2020-01-19 MED ORDER — ASPIRIN EC 81 MG PO TBEC
81.0000 mg | DELAYED_RELEASE_TABLET | Freq: Every day | ORAL | Status: DC
  Administered 2020-01-19 – 2020-01-24 (×6): 81 mg via ORAL
  Filled 2020-01-19 (×6): qty 1

## 2020-01-19 MED ORDER — GUAIFENESIN-DM 100-10 MG/5ML PO SYRP: 10.0000 mL | ORAL_SOLUTION | ORAL | Status: DC | PRN

## 2020-01-19 MED ORDER — ZINC SULFATE 220 (50 ZN) MG PO CAPS
220.0000 mg | ORAL_CAPSULE | Freq: Every day | ORAL | Status: DC
  Administered 2020-01-19 – 2020-01-24 (×6): 220 mg via ORAL
  Filled 2020-01-19 (×6): qty 1

## 2020-01-19 MED ORDER — SODIUM CHLORIDE 0.9 % IV SOLN
100.0000 mg | INTRAVENOUS | Status: AC
  Administered 2020-01-19 (×2): 100 mg via INTRAVENOUS

## 2020-01-19 MED ORDER — ALBUTEROL SULFATE HFA 108 (90 BASE) MCG/ACT IN AERS
2.0000 | INHALATION_SPRAY | Freq: Four times a day (QID) | RESPIRATORY_TRACT | Status: DC
  Administered 2020-01-19 (×4): 2 via RESPIRATORY_TRACT
  Filled 2020-01-19 (×2): qty 6.7

## 2020-01-19 NOTE — ED Notes (Signed)
Male purewick placed on pt

## 2020-01-19 NOTE — H&P (Signed)
History and Physical    AKAASH Allen OFB:[id number] DOB: 1941-01-10 DOA: 01/18/2020  PCP: Redmond School, MD   Patient coming from: Home.  I have personally briefly reviewed patient's old medical records in [place]  Chief Complaint: AMS.  HPI: Henry Allen is a 79 y.o. male with medical history significant of repaired AAA, CAD/CABG, elevated PSA, history of hemorrhagic stroke, hyperlipidemia, hypertension, renal artery stenosis, ventral hernia who is coming to the emergency department via EMS after he was found in bed by a family member with blood on his arm and face, but neither the patient or family did not know exactly how things happen. His daughter spoke to Dr. Sabra Heck earlier and stated that he has been having progressive mild confusion. He is unable to provide further information as he continued to be confused oriented to name, partially oriented to place, disoriented to time and situation. He denies having any headaches, chest, back or abdominal pain at this time. He has some discomfort on his arm and nose area.  ED Course: Initial vital signs temperature 97.7 F, pulse 53, respiration 14, blood pressure 82/56 mmHg O2 sat 92% in room air. The patient is O2 sat has been around 92 to 94% on 2 LPM via [place].   Arterial blood gas showed decreased PO2 74.0 mmHg and acid base excess of 4.0 mmol/L. The rest of the values are within normal limits. CBC was unremarkable. Ammonia and alcohol level were normal. Troponin was elevated at 74 ng/L. His CMP showed normal sodium, chloride, CO2 total protein and transaminases. Potassium was 2.9 mmol/L. Calcium was 8.3 mg/dL, but albumin was 3.4 g/dL. The rest of the electrolytes are within normal limits. Total bilirubin was 1.3, glucose 135, BUN 24 and creatinine 1.38 mg/dL.  Imaging: Forearm radiograph did not show any acute fracture. His chest radiograph showed minimal subpleural reticulation at the lung base, favor atelectasis or scarring.  There was upper normal heart size. Aortic atherosclerosis. CT head, CT C-spine and CT maxillofacial without contrast did not show any acute abnormalities. However, it showed multiple chronic changes. Please see images and full radiology report for further detail.  Review of Systems: As per HPI otherwise all other systems reviewed and are negative.  Past Medical History:  Diagnosis Date  . Abdominal aortic aneurysm ([place]) 05/06/2008   7cm Infrarenal abdominal aortic aneurysm and bilateral common iliac aneurysms using a 20x10 bifurcated graft  . CAD (coronary artery disease) 2009   CABG   . Elevated PSA   . Hemorrhagic stroke ([place]) 1999  . Hyperlipemia   . Hypertension   . Renal artery stenosis ([facility])    he has minor atherosclerosis in the right renal artery but a 60-99% stenosis in the left renal artery.  . Stroke ([place])   . Ventral hernia    Past Surgical History:  Procedure Laterality Date  . CARDIAC CATHETERIZATION     showed severe three-vessel coronary disease  . CORONARY ARTERY BYPASS GRAFT  11/02/2007   x4 by Dr Gilford Raid   Social History  reports that he has quit smoking. His smoking use included cigarettes. He smoked 1.00 pack per day. He has never used smokeless tobacco. No history on file for alcohol use and drug use.  Allergies  Allergen Reactions  . Morphine Other (See Comments)    hallucinate   Family History  Problem Relation Age of Onset  . Heart attack Mother 58  . Heart attack Father 27  . Heart attack Brother  x2 bother at age 25   Prior to Admission medications   Medication Sig Start Date End Date Taking? Authorizing Provider  amLODipine (NORVASC) 10 MG tablet Take 10 mg by mouth daily.    [provider]  amLODipine (NORVASC) 10 MG tablet Take by mouth.    [provider]  aspirin 81 MG tablet Take 81 mg by mouth daily.    [provider]  calcium-vitamin D (OSCAL WITH D) 500-200 MG-UNIT tablet Take 1 tablet by mouth.     [provider]  Flaxseed, Linseed, (FLAX SEED OIL) 1300 MG CAPS Take by mouth.    [provider]  labetalol (NORMODYNE) 200 MG tablet Take 500 mg by mouth 2 (two) times daily.    [provider]  lisinopril (PRINIVIL,ZESTRIL) 40 MG tablet Take 40 mg by mouth 2 (two) times daily.    [provider]  metoprolol tartrate (LOPRESSOR) 25 MG tablet Take 25 mg by mouth 2 (two) times daily.    [provider]   Physical Exam: Vitals:   01/19/20 0200 01/19/20 0230 01/19/20 0300 01/19/20 0330  BP: (!) 168/71 (!) 166/66 (!) 165/77 (!) 176/71  Pulse: (!) 50 (!) 46 (!) 49 (!) 47  Resp: 18 20 20  (!) 21  Temp:      TempSrc:      SpO2: 94% 93% 94% 92%  Weight:      Height:       Constitutional: NAD, calm, comfortable Eyes: PERRL, lids and conjunctivae are injected, particularly on the right eye. ENMT: Nasal bridge laceration. Mucous membranes are moist. Posterior pharynx clear of any exudate or lesions. Neck: normal, supple, no masses, no thyromegaly Respiratory: Clear to auscultation bilaterally, no wheezing, no crackles. Normal respiratory effort. No accessory muscle use.  Cardiovascular: Bradycardic in the 40s, no murmurs / rubs / gallops. No extremity edema. 2+ pedal pulses. No carotid bruits.  Abdomen: Nondistended. BS positive. Soft, no tenderness, no masses palpated. No hepatosplenomegaly. Musculoskeletal: no clubbing / cyanosis. Good ROM, no contractures. Normal muscle tone.  Skin: Some lacerations on right forearm area with dressing in place. Neurologic: CN 2-12 grossly intact. Sensation intact, DTR normal. Generalized weakness. Psychiatric: Alert, oriented x2, oriented to place with some difficulty. Disoriented to time, date and situation.  Labs on Admission: I have personally reviewed following labs and imaging studies  CBC: Recent Labs  Lab 01/18/20 2300  WBC 7.9  NEUTROABS 5.9  HGB 13.7  HCT 42.9  MCV 88.1  PLT 171    Basic  Metabolic Panel: Recent Labs  Lab 01/18/20 2300  NA 139  K 2.9*  CL 102  CO2 26  GLUCOSE 135*  BUN 24*  CREATININE 1.38*  CALCIUM 8.3*    GFR: Estimated Creatinine Clearance: 46.5 mL/min (A) (by C-G formula based on SCr of 1.38 mg/dL (H)).  Liver Function Tests: Recent Labs  Lab 01/18/20 2300  AST 22  ALT 16  ALKPHOS 53  BILITOT 1.3*  PROT 6.7  ALBUMIN 3.4*   Radiological Exams on Admission: DG Forearm Right  Result Date: 01/19/2020 CLINICAL DATA:  79 year old male with fall and trauma to the right upper extremity. EXAM: RIGHT FOREARM - 2 VIEW COMPARISON:  None. FINDINGS: There is no acute fracture or dislocation. Evaluation is limited as a true 90 degree lateral view is not provided. The bones are mildly osteopenic. No significant arthritic changes. No joint effusion. Probable small exostosis of the volar aspect of the distal humerus. The soft tissues are unremarkable. IMPRESSION: No  acute fracture or dislocation. Electronically Signed   By: Anner Crete M.D.   On: 01/19/2020 01:27   CT Head Wo Contrast  Result Date: 01/18/2020 CLINICAL DATA:  Facial trauma. Altered mental status. Blood noted to nose. EXAM: CT HEAD WITHOUT CONTRAST TECHNIQUE: Contiguous axial images were obtained from the base of the skull through the vertex without intravenous contrast. COMPARISON:  None. FINDINGS: Brain: No hemorrhage or evidence of acute ischemia. Generalized atrophy with moderate chronic small vessel ischemia. Encephalomalacia in the high left frontal lobe consistent with remote infarct. Remote lacunar infarct in the left caudate and basal ganglia. Slight asymmetry of the lateral ventricles with left larger than right, possible ex vacuo dilatation. No hydrocephalus, the basilar cisterns are patent. There is no subdural or extra-axial collection. No concerning intraabdominal mass effect. Vascular: Vascular calcifications at the skull base without hyperdense vessel. Skull: Probable remote  left frontal burr hole. No skull fracture or focal lesion. Sinuses/Orbits: Scattered opacification of lower mastoid air cells. Detailed evaluation on concurrent face CT, reported separately. Other: None. IMPRESSION: 1. No acute intracranial abnormality. No skull fracture. 2. Remote left frontal infarct. Remote lacunar infarcts in the left caudate and basal ganglia. Electronically Signed   By: Keith Rake M.D.   On: 01/18/2020 23:30   CT Cervical Spine Wo Contrast  Result Date: 01/18/2020 CLINICAL DATA:  Facial trauma.  Altered mental status. EXAM: CT CERVICAL SPINE WITHOUT CONTRAST TECHNIQUE: Multidetector CT imaging of the cervical spine was performed without intravenous contrast. Multiplanar CT image reconstructions were also generated. COMPARISON:  None. FINDINGS: Alignment: Minimal anterolisthesis of C7 on T1, likely degenerative. No traumatic subluxation. No jumped or perched facets. C1 well aligned on C2. Skull base and vertebrae: No acute fracture. Vertebral body heights are maintained. The dens and skull base are intact. Soft tissues and spinal canal: No prevertebral fluid or swelling. No visible canal hematoma. Disc levels: Diffuse endplate spurring. There is disc space narrowing at C4-C5, C5-C6 and C6-C7. Disc calcification at C4-C5. Multilevel facet hypertrophy, most prominently involving C4-C5 on the right. Multilevel neural foraminal stenosis. No bony canal narrowing. Upper chest: Emphysema with biapical pleuroparenchymal scarring. No acute findings. Other: Carotid calcifications. IMPRESSION: Multilevel degenerative change in the cervical spine without acute fracture or subluxation. Electronically Signed   By: Keith Rake M.D.   On: 01/18/2020 23:37   DG Chest Port 1 View  Result Date: 01/18/2020 CLINICAL DATA:  Shortness of breath.  Altered mental status. EXAM: PORTABLE CHEST 1 VIEW COMPARISON:  Remote radiograph 05/07/2008 FINDINGS: Patient is rotated. Post median sternotomy. Upper  normal heart size. Tortuous aorta likely accentuated by rotation. Aortic atherosclerosis. Minimal subpleural reticulation at the left lung base. Mild biapical pleuroparenchymal scarring. No confluent consolidation. No pneumothorax or large pleural effusion. No acute osseous abnormalities are seen. IMPRESSION: 1. Minimal subpleural reticulation at the left lung base, favor atelectasis or scarring. 2. Upper normal heart size. Aortic tortuosity likely accentuated by rotation. Aortic Atherosclerosis (ICD10-I70.0). Electronically Signed   By: Keith Rake M.D.   On: 01/18/2020 22:51   CT Maxillofacial Wo Contrast  Result Date: 01/18/2020 CLINICAL DATA:  Facial trauma.  Blood from nose. EXAM: CT MAXILLOFACIAL WITHOUT CONTRAST TECHNIQUE: Multidetector CT imaging of the maxillofacial structures was performed. Multiplanar CT image reconstructions were also generated. COMPARISON:  None. FINDINGS: Osseous: No acute nasal bone fracture. The zygomatic arches and mandibles are intact. The temporomandibular joints are congruent. Poor dentition with predominantly missing teeth but scattered dental caries and periapical lucencies. No evidence of 2  fracture. Orbits: No orbital fracture.  No evidence of globe injury. Sinuses: Diffuse paranasal sinus mucosal thickening. Opacification of ethmoid air cells with mucosal thickening of the frontal maxillary sinuses. No sinus fracture or fluid level. Scattered opacification of bilateral mastoid air cells. Soft tissues: No confluent soft tissue hematoma. No soft tissue air. Limited intracranial: Assessed on concurrent head CT, reported separately. IMPRESSION: 1. No facial bone fracture. 2. Poor dentition with predominantly missing teeth but scattered dental caries and periapical lucencies. 3. Diffuse paranasal sinus mucosal thickening. No sinus fracture or fluid level. Electronically Signed   By: Keith Rake M.D.   On: 01/18/2020 23:33   EKG: Independently reviewed. Vent. rate  50 BPM PR interval * ms QRS duration 102 ms QT/QTc 538/491 ms P-R-T axes 35 23 -89 Sinus rhythm LVH with secondary repolarization abnormality Anterior Q waves, possibly due to LVH Baseline wander in lead(s) V2  Assessment/Plan Principal Problem:   2019 novel coronavirus disease (COVID-19) Observation/telemetry. Continue supplemental oxygen. Continue bronchodilators. Antitussives as needed. Remdesivir per pharmacy. Solu-Medrol 0.5 mg/kg IVP every 12 hours. Monitor CBC, CMP and inflammatory markers.  Active Problems:   AMS (altered mental status) (Vascular dementia with Covid and symptomatic bradycardia) Seems to have improved with supplemental oxygen. I work he is in the hospital in [place], [place] with some difficulty.    Symptomatic bradycardia Hold beta-blockers. Continue cardiac telemetry.    Elevated troponin Trend troponin level.    CAD (coronary artery disease) Hold beta-blockers. Continue aspirin. Not on statin.    Hyperlipemia Not on statin. The patient is taking flaxseed oil home.    Hypertension Continue amlodipine 10 mg p.o. daily. Continue lisinopril 40 mg p.o. daily. Hold beta-blockers. Monitor BP, HR, renal function electrolytes.    Hypokalemia Replace. Follow-up potassium level.    Tobacco use Declined nicotine replacement therapy.    CKD (chronic kidney disease) stage 3, GFR 30-59 ml/min Around baseline. Monitor renal function electrolytes.    Hyperbilirubinemia Follow-up total bilirubin level.    DVT prophylaxis: Lovenox SQ. Code Status:   Full code. Family Communication: Disposition Plan:   Patient is from:  Home.  Anticipated DC to:  TBD.  Anticipated DC date:  01/20/2020.  Anticipated DC barriers: Clinical status.  Consults called: Admission status:  Observation/telemetry.   Severity of Illness:High given the patient's worsening mental status in the presence of COVID-19 disease and bradycardia. I suspect the patient  sustained his nasal and right arm injuries due to near syncopal or syncopal event secondary to bradycardiac and COVID-19 disease weakness.  Reubin Milan MD [facility]  How to contact the [facility] Attending or Consulting provider [place] or covering provider during after hours [place], for this patient?   1. Check the care team in [facility] and look for a) attending/consulting TR[facility] provider listed and b) the [facility] team listed 2. Log into [email or web address] and use [facility]'s universal password to access. If you do not have the password, please contact the hospital operator. 3. Locate the [facility] provider you are looking for under [facility] and page to a number that you can be directly reached. 4. If you still have difficulty reaching the provider, please page the [facility] (Director on Call) for the Hospitalists listed on amion for assistance.  01/19/2020, 4:11 AM   This document was prepared using Dragon voice recognition software and may contain some unintended transcription errors.

## 2020-01-19 NOTE — ED Notes (Signed)
Notified Dr Olevia Bowens that pt has not voided since arrival and thus UA that had been ordered had not been collected. Asked MD if he wanted [place] to I&O pt for urine specimen but he stated that we should wait on it.

## 2020-01-19 NOTE — Progress Notes (Signed)
Per HPI: Henry Allen is a 79 y.o. male with medical history significant of repaired AAA, CAD/CABG, elevated PSA, history of hemorrhagic stroke, hyperlipidemia, hypertension, renal artery stenosis, ventral hernia who is coming to the emergency department via EMS after he was found in bed by a family member with blood on his arm and face, but neither the patient or family did not know exactly how things happen. His daughter spoke to Dr. Sabra Heck earlier and stated that he has been having progressive mild confusion. He is unable to provide further information as he continued to be confused oriented to name, partially oriented to place, disoriented to time and situation. He denies having any headaches, chest, back or abdominal pain at this time. He has some discomfort on his arm and nose area.  ED Course: Initial vital signs temperature 97.7 F, pulse 53, respiration 14, blood pressure 82/56 mmHg O2 sat 92% in room air. The patient is O2 sat has been around 92 to 94% on 2 LPM via [place].   Arterial blood gas showed decreased PO2 74.0 mmHg and acid base excess of 4.0 mmol/L. The rest of the values are within normal limits. CBC was unremarkable. Ammonia and alcohol level were normal. Troponin was elevated at 74 ng/L. His CMP showed normal sodium, chloride, CO2 total protein and transaminases. Potassium was 2.9 mmol/L. Calcium was 8.3 mg/dL, but albumin was 3.4 g/dL. The rest of the electrolytes are within normal limits. Total bilirubin was 1.3, glucose 135, BUN 24 and creatinine 1.38 mg/dL.  8/29: Patient has been admitted with altered mentation in the setting of Covid pneumonia and has been started on remdesivir as well as Solu-Medrol.  I have seen and evaluated him at bedside.  He has been admitted it past midnight.  He is currently on 2 L nasal cannula oxygen.  According to the daughter, patient has been having progressive mild confusion.  Lab work for confusion has been essentially unremarkable.  He is noted to  have significant CAD history with elevated troponin level with flat trend noted.  Chest x-ray with minimal findings to left lung base noted.  Ammonia level of 9 and CT head negative.  Plan to check TSH as well as Z61 and folic acid levels.  Discussed with daughter on phone [place] on 8/29.  They are open to considering SNF as needed.  Total care time: 35 minutes.

## 2020-01-19 NOTE — ED Notes (Signed)
Pt found roaming in the hall without mask twice since 0700.  Pt is COVID +.

## 2020-01-19 NOTE — TOC Initial Note (Signed)
Transition of Care [facility]) - Initial/Assessment Note    Patient Details  Name: Henry Allen MRN: [id number] Date of Birth: Oct 19, 1940  Transition of [facility]) CM/SW Contact:    Verdell Carmine, RN Phone Number: 01/19/2020, 9:31 AM  Clinical Narrative:                 Daughter Vickie reached out to this CM. She is a previous Furniture conservator/restorer.  Patient has been vaccinated  At [employer] had Pfizer vaccine she thinks.  ( it was a 2 shot) He lives here alone, his daughter lives across the street with her son and grandson. He has not been going anywhere, but grandson has been over helping him., and grandson works.  Daughter Vickie lives in [place] and has her mother with her, but is the point of contact for any medical questions or concerns and then will explain to other sister.who is local.  They are now aware of his OCVID status  Updated on admission. .        Patient Goals and CMS Choice        Expected Discharge Plan and Services                                                Prior Living Arrangements/Services                       Activities of Daily Living      Permission Sought/Granted                  Emotional Assessment              Admission diagnosis:  AMS (altered mental status) [R41.82] 2019 novel coronavirus disease (COVID-19) [U07.1] Patient Active Problem List   Diagnosis Date Noted  . AMS (altered mental status) 01/19/2020  . 2019 novel coronavirus disease (COVID-19) 01/19/2020  . Hyperlipemia   . Hypertension   . Symptomatic bradycardia   . Hypokalemia   . Elevated troponin   . Tobacco use   . CKD (chronic kidney disease) stage 3, GFR 30-59 ml/min   . Vascular dementia ([place])   . CAD (coronary artery disease) 2009   PCP:  Redmond School, MD Pharmacy:   [id number] - [place], [address] [place] [address] Phone: [phone] Fax:  [phone]     Social Determinants of Health (SDOH) Interventions    Readmission Risk Interventions No flowsheet data found.

## 2020-01-19 NOTE — ED Provider Notes (Signed)
Assumed care from Dr. Sabra Heck.  Patient here with altered mental status unclear less than normal.  Found to have blood on his face and blood in his arm.  Traumatic imaging is negative.  He is bradycardic and hypokalemic.  Plan admission to the hospital for further work-up of his altered mental status.  Potassium replacement ordered.  Discussed with Dr. Olevia Bowens.   Ezequiel Essex, MD 01/19/20 [phone]

## 2020-01-20 LAB — COMPREHENSIVE METABOLIC PANEL
ALT: 14 U/L (ref 0–44)
AST: 19 U/L (ref 15–41)
Albumin: 2.9 g/dL — ABNORMAL LOW (ref 3.5–5.0)
Alkaline Phosphatase: 46 U/L (ref 38–126)
Anion gap: 9 (ref 5–15)
BUN: 22 mg/dL (ref 8–23)
CO2: 24 mmol/L (ref 22–32)
Calcium: 8.2 mg/dL — ABNORMAL LOW (ref 8.9–10.3)
Chloride: 106 mmol/L (ref 98–111)
Creatinine, Ser: 0.93 mg/dL (ref 0.61–1.24)
GFR calc Af Amer: >60 mL/min (ref >60)
GFR calc non Af Amer: >60 mL/min (ref >60)
Glucose, Bld: 164 mg/dL — ABNORMAL HIGH (ref 70–99)
Potassium: 3.4 mmol/L — ABNORMAL LOW (ref 3.5–5.1)
Sodium: 139 mmol/L (ref 135–145)
Total Bilirubin: 0.9 mg/dL (ref 0.3–1.2)
Total Protein: 6 g/dL — ABNORMAL LOW (ref 6.5–8.1)

## 2020-01-20 LAB — CBC WITH DIFFERENTIAL/PLATELET
Abs Immature Granulocytes: 0.1 10*3/uL — ABNORMAL HIGH (ref 0.00–0.07)
Basophils Absolute: 0 10*3/uL (ref 0.0–0.1)
Basophils Relative: 0 %
Eosinophils Absolute: 0 10*3/uL (ref 0.0–0.5)
Eosinophils Relative: 0 %
HCT: 41.5 % (ref 39.0–52.0)
Hemoglobin: 13.4 g/dL (ref 13.0–17.0)
Immature Granulocytes: 1 %
Lymphocytes Relative: 7 %
Lymphs Abs: 0.6 10*3/uL — ABNORMAL LOW (ref 0.7–4.0)
MCH: 27.9 pg (ref 26.0–34.0)
MCHC: 32.3 g/dL (ref 30.0–36.0)
MCV: 86.5 fL (ref 80.0–100.0)
Monocytes Absolute: 0.4 10*3/uL (ref 0.1–1.0)
Monocytes Relative: 5 %
Neutro Abs: 7.8 10*3/uL — ABNORMAL HIGH (ref 1.7–7.7)
Neutrophils Relative %: 87 %
Platelets: 204 10*3/uL (ref 150–400)
RBC: 4.8 MIL/uL (ref 4.22–5.81)
RDW: 13.8 % (ref 11.5–15.5)
WBC: 9 10*3/uL (ref 4.0–10.5)
nRBC: 0 % (ref 0.0–0.2)

## 2020-01-20 LAB — C-REACTIVE PROTEIN: CRP: 12.4 mg/dL — ABNORMAL HIGH (ref <1.0)

## 2020-01-20 LAB — PHOSPHORUS: Phosphorus: 2 mg/dL — ABNORMAL LOW (ref 2.5–4.6)

## 2020-01-20 LAB — D-DIMER, QUANTITATIVE: D-Dimer, Quant: 3.71 ug/mL-FEU — ABNORMAL HIGH (ref 0.00–0.50)

## 2020-01-20 LAB — ABO/RH: ABO/RH(D): A POS

## 2020-01-20 LAB — FERRITIN: Ferritin: 453 ng/mL — ABNORMAL HIGH (ref 24–336)

## 2020-01-20 LAB — MAGNESIUM: Magnesium: 1.9 mg/dL (ref 1.7–2.4)

## 2020-01-20 MED ORDER — ASCORBIC ACID 500 MG PO TABS: 500.0000 mg | ORAL_TABLET | Freq: Every day | ORAL | 0 refills | Status: AC

## 2020-01-20 MED ORDER — ALBUTEROL SULFATE HFA 108 (90 BASE) MCG/ACT IN AERS: 2.0000 | INHALATION_SPRAY | Freq: Four times a day (QID) | RESPIRATORY_TRACT | 0 refills | Status: AC | PRN

## 2020-01-20 MED ORDER — LORAZEPAM 2 MG/ML IJ SOLN
1.0000 mg | Freq: Once | INTRAMUSCULAR | Status: AC
  Administered 2020-01-20: 1 mg via INTRAVENOUS
  Filled 2020-01-20: qty 1

## 2020-01-20 MED ORDER — ALBUTEROL SULFATE HFA 108 (90 BASE) MCG/ACT IN AERS
2.0000 | INHALATION_SPRAY | Freq: Three times a day (TID) | RESPIRATORY_TRACT | Status: DC
  Administered 2020-01-20: 2 via RESPIRATORY_TRACT

## 2020-01-20 MED ORDER — ZINC SULFATE 220 (50 ZN) MG PO CAPS: 220.0000 mg | ORAL_CAPSULE | Freq: Every day | ORAL | 0 refills | Status: AC

## 2020-01-20 MED ORDER — ALBUTEROL SULFATE HFA 108 (90 BASE) MCG/ACT IN AERS: 2.0000 | INHALATION_SPRAY | Freq: Four times a day (QID) | RESPIRATORY_TRACT | Status: DC | PRN

## 2020-01-20 MED ORDER — POTASSIUM CHLORIDE CRYS ER 20 MEQ PO TBCR
40.0000 meq | EXTENDED_RELEASE_TABLET | Freq: Once | ORAL | Status: AC
  Administered 2020-01-20: 40 meq via ORAL
  Filled 2020-01-20: qty 2

## 2020-01-20 MED ORDER — DEXAMETHASONE 6 MG PO TABS: 6.0000 mg | ORAL_TABLET | Freq: Two times a day (BID) | ORAL | 0 refills | Status: DC

## 2020-01-20 MED ORDER — GUAIFENESIN-DM 100-10 MG/5ML PO SYRP: 10.0000 mL | ORAL_SOLUTION | ORAL | 0 refills | Status: AC | PRN

## 2020-01-20 NOTE — Progress Notes (Signed)
Pt's BP 171/84.  Provider notified.  Awaiting orders.

## 2020-01-20 NOTE — Discharge Summary (Addendum)
Physician Discharge Summary  Henry Allen DQQ:[id number] DOB: 10-06-1940 DOA: 01/18/2020  PCP: Henry School, MD  Admit date: 01/18/2020  Discharge date: 01/24/2020  Admitted From:Home  Disposition:  SNF  Recommendations for Outpatient Follow-up:  1. Follow up with PCP in 2 weeks 2. Please obtain BMP/CBC in 1-2 week2 3. Continue on dexamethasone as prescribed 4. Continue antitussives and inhalers as needed for symptomatic management  Home Health:None  Equipment/Devices:None  Discharge Condition:Stable  CODE STATUS: DNR  Diet recommendation: Heart Healthy  Brief/Interim Summary: Per HPI: Henry Allen a 79 y.o.malewith medical history significant ofrepaired AAA, CAD/CABG, elevated PSA, history of hemorrhagic stroke, hyperlipidemia, hypertension, renal artery stenosis, ventral herniawho is coming to the emergency department via EMS after he was found in bed by a family member with blood on his arm and face, but neither the patient or familydid not know exactly how things happen.Hisdaughter spoke to Dr. Sabra Heck earlier and stated that he has been having progressive mild confusion. He is unable to provide further information as he continued to be confused oriented to name, partially oriented to place, disoriented to time and situation. He denies having any headaches, chest, back or abdominal pain at this time. He has some discomfort on his arm and nose area.  ED Course:Initial vital signs temperature 97.7 F, pulse 53, respiration 14, blood pressure 82/56 mmHg O2 sat 92% in room air. The patient is O2 sat has been around 92 to 94% on 2 LPM via [place].   Arterial blood gas showed decreased PO2 74.0 mmHg and acid base excess of 4.0 mmol/L. The rest of the values are within normal limits. CBC was unremarkable. Ammonia and alcohol level were normal. Troponin was elevated at 74 ng/L. His CMP showed normal sodium, chloride, CO2 total protein and transaminases. Potassium was 2.9  mmol/L. Calcium was 8.3 mg/dL, but albumin was 3.4 g/dL. The rest of the electrolytes are within normal limits. Total bilirubin was 1.3, glucose 135, BUN 24 and creatinine 1.38 mg/dL.  8/29: Patient has been admitted with altered mentation in the setting of Covid pneumonia and has been started on remdesivir as well as Solu-Medrol.  I have seen and evaluated him at bedside.  He has been admitted it past midnight.  He is currently on 2 L nasal cannula oxygen.  According to the daughter, patient has been having progressive mild confusion.  Lab work for confusion has been essentially unremarkable.  He is noted to have significant CAD history with elevated troponin level with flat trend noted.  Chest x-ray with minimal findings to left lung base noted.  Ammonia level of 9 and CT head negative.  Plan to check TSH as well as J94 and folic acid levels.  Discussed with daughter on phone [place] on 8/29.  They are open to considering SNF as needed.  8/30: Patient continues to have some intermittent confusion, but with downward trend of inflammatory markers overall noted.  D-dimer is also noted to be downward trending.  Plan to continue on remdesivir infusions as an outpatient as well as dexamethasone as prescribed.  Family members are in agreement that going to SNF would be safest for this patient given concerns for lack of appropriate care at home.  9/2: Pt remains stable.  He completed final remdesivir infusion today.  He will need 5 more days of decadron therapy to complete course.   Plan is for [facility]24[facility]20.    Discharge Diagnoses:  Principal Problem:   2019 novel coronavirus disease (COVID-19)  Active Problems:   AMS (altered mental status)   CAD (coronary artery disease)   Hyperlipemia   Hypertension   Symptomatic bradycardia   Hypokalemia   Elevated troponin   Tobacco use   CKD (chronic kidney disease) stage 3, GFR 30-59 ml/min   Vascular dementia ([place])  Principal discharge diagnosis:  Acute metabolic encephalopathy in the setting of progressive dementia in the setting of COVID-19 pneumonia.  Discharge Instructions  Discharge Instructions    Diet - low sodium heart healthy   Complete by: As directed    If the dressing is still on your incision site when you go home, remove it on the third day after your surgery date. Remove dressing if it begins to fall off, or if it is dirty or damaged before the third day.   Complete by: As directed    Increase activity slowly   Complete by: As directed      Allergies as of 01/23/2020      Reactions   Morphine Other (See Comments)   hallucinate      Medication List    STOP taking these medications   labetalol 200 MG tablet Commonly known as: NORMODYNE     TAKE these medications   albuterol 108 (90 Base) MCG/ACT inhaler Commonly known as: VENTOLIN HFA Inhale 2 puffs into the lungs every 6 (six) hours as needed for wheezing or shortness of breath.   amLODipine 10 MG tablet Commonly known as: NORVASC Take 10 mg by mouth daily.   ascorbic acid 500 MG tablet Commonly known as: VITAMIN C Take 1 tablet (500 mg total) by mouth daily.   aspirin 81 MG tablet Take 81 mg by mouth daily.   calcium-vitamin D 500-200 MG-UNIT tablet Commonly known as: OSCAL WITH D Take 1 tablet by mouth.   dexamethasone 6 MG tablet Commonly known as: DECADRON Take 1 tablet (6 mg total) by mouth daily for 5 days. Start taking on: January 25, 2020   Flax Seed Oil 1300 MG Caps Take by mouth.   guaiFENesin-dextromethorphan 100-10 MG/5ML syrup Commonly known as: ROBITUSSIN DM Take 10 mLs by mouth every 4 (four) hours as needed for cough.   lisinopril 40 MG tablet Commonly known as: ZESTRIL Take 40 mg by mouth 2 (two) times daily.   metoprolol tartrate 25 MG tablet Commonly known as: LOPRESSOR Take 25 mg by mouth 2 (two) times daily.   omeprazole 20 MG capsule Commonly known as: PriLOSEC Take 1 capsule (20 mg total) by mouth daily.    zinc sulfate 220 (50 Zn) MG capsule Take 1 capsule (220 mg total) by mouth daily.            Discharge Care Instructions  (From admission, onward)         Start     Ordered   01/20/20 0000  If the dressing is still on your incision site when you go home, remove it on the third day after your surgery date. Remove dressing if it begins to fall off, or if it is dirty or damaged before the third day.        01/20/20 1158          Follow-up Information    Henry School, MD. Schedule an appointment as soon as possible for a visit in 2 week(s).   Specialty: Internal Medicine Contact information: [address] [place] [address] [phone]              Allergies  Allergen Reactions  . Morphine  Other (See Comments)    hallucinate    Consultations:  None   Procedures/Studies: DG Forearm Right  Result Date: 01/19/2020 CLINICAL DATA:  79 year old male with fall and trauma to the right upper extremity. EXAM: RIGHT FOREARM - 2 VIEW COMPARISON:  None. FINDINGS: There is no acute fracture or dislocation. Evaluation is limited as a true 90 degree lateral view is not provided. The bones are mildly osteopenic. No significant arthritic changes. No joint effusion. Probable small exostosis of the volar aspect of the distal humerus. The soft tissues are unremarkable. IMPRESSION: No acute fracture or dislocation. Electronically Signed   By: Anner Crete M.D.   On: 01/19/2020 01:27   CT Head Wo Contrast  Result Date: 01/18/2020 CLINICAL DATA:  Facial trauma. Altered mental status. Blood noted to nose. EXAM: CT HEAD WITHOUT CONTRAST TECHNIQUE: Contiguous axial images were obtained from the base of the skull through the vertex without intravenous contrast. COMPARISON:  None. FINDINGS: Brain: No hemorrhage or evidence of acute ischemia. Generalized atrophy with moderate chronic small vessel ischemia. Encephalomalacia in the high left frontal lobe consistent with remote  infarct. Remote lacunar infarct in the left caudate and basal ganglia. Slight asymmetry of the lateral ventricles with left larger than right, possible ex vacuo dilatation. No hydrocephalus, the basilar cisterns are patent. There is no subdural or extra-axial collection. No concerning intraabdominal mass effect. Vascular: Vascular calcifications at the skull base without hyperdense vessel. Skull: Probable remote left frontal burr hole. No skull fracture or focal lesion. Sinuses/Orbits: Scattered opacification of lower mastoid air cells. Detailed evaluation on concurrent face CT, reported separately. Other: None. IMPRESSION: 1. No acute intracranial abnormality. No skull fracture. 2. Remote left frontal infarct. Remote lacunar infarcts in the left caudate and basal ganglia. Electronically Signed   By: Keith Rake M.D.   On: 01/18/2020 23:30   CT Cervical Spine Wo Contrast  Result Date: 01/18/2020 CLINICAL DATA:  Facial trauma.  Altered mental status. EXAM: CT CERVICAL SPINE WITHOUT CONTRAST TECHNIQUE: Multidetector CT imaging of the cervical spine was performed without intravenous contrast. Multiplanar CT image reconstructions were also generated. COMPARISON:  None. FINDINGS: Alignment: Minimal anterolisthesis of C7 on T1, likely degenerative. No traumatic subluxation. No jumped or perched facets. C1 well aligned on C2. Skull base and vertebrae: No acute fracture. Vertebral body heights are maintained. The dens and skull base are intact. Soft tissues and spinal canal: No prevertebral fluid or swelling. No visible canal hematoma. Disc levels: Diffuse endplate spurring. There is disc space narrowing at C4-C5, C5-C6 and C6-C7. Disc calcification at C4-C5. Multilevel facet hypertrophy, most prominently involving C4-C5 on the right. Multilevel neural foraminal stenosis. No bony canal narrowing. Upper chest: Emphysema with biapical pleuroparenchymal scarring. No acute findings. Other: Carotid calcifications.  IMPRESSION: Multilevel degenerative change in the cervical spine without acute fracture or subluxation. Electronically Signed   By: Keith Rake M.D.   On: 01/18/2020 23:37   DG Chest Port 1 View  Result Date: 01/18/2020 CLINICAL DATA:  Shortness of breath.  Altered mental status. EXAM: PORTABLE CHEST 1 VIEW COMPARISON:  Remote radiograph 05/07/2008 FINDINGS: Patient is rotated. Post median sternotomy. Upper normal heart size. Tortuous aorta likely accentuated by rotation. Aortic atherosclerosis. Minimal subpleural reticulation at the left lung base. Mild biapical pleuroparenchymal scarring. No confluent consolidation. No pneumothorax or large pleural effusion. No acute osseous abnormalities are seen. IMPRESSION: 1. Minimal subpleural reticulation at the left lung base, favor atelectasis or scarring. 2. Upper normal heart size. Aortic tortuosity likely accentuated by rotation.  Aortic Atherosclerosis (ICD10-I70.0). Electronically Signed   By: Keith Rake M.D.   On: 01/18/2020 22:51   CT Maxillofacial Wo Contrast  Result Date: 01/18/2020 CLINICAL DATA:  Facial trauma.  Blood from nose. EXAM: CT MAXILLOFACIAL WITHOUT CONTRAST TECHNIQUE: Multidetector CT imaging of the maxillofacial structures was performed. Multiplanar CT image reconstructions were also generated. COMPARISON:  None. FINDINGS: Osseous: No acute nasal bone fracture. The zygomatic arches and mandibles are intact. The temporomandibular joints are congruent. Poor dentition with predominantly missing teeth but scattered dental caries and periapical lucencies. No evidence of 2 fracture. Orbits: No orbital fracture.  No evidence of globe injury. Sinuses: Diffuse paranasal sinus mucosal thickening. Opacification of ethmoid air cells with mucosal thickening of the frontal maxillary sinuses. No sinus fracture or fluid level. Scattered opacification of bilateral mastoid air cells. Soft tissues: No confluent soft tissue hematoma. No soft tissue  air. Limited intracranial: Assessed on concurrent head CT, reported separately. IMPRESSION: 1. No facial bone fracture. 2. Poor dentition with predominantly missing teeth but scattered dental caries and periapical lucencies. 3. Diffuse paranasal sinus mucosal thickening. No sinus fracture or fluid level. Electronically Signed   By: Keith Rake M.D.   On: 01/18/2020 23:33    Discharge Exam: Vitals:   01/22/20 2137 01/23/20 1118  BP: (!) 172/82 (!) 154/94  Pulse: 60   Resp: 18   Temp: 98.8 F (37.1 C)   SpO2: 96%    Vitals:   01/22/20 1446 01/22/20 2120 01/22/20 2137 01/23/20 1118  BP: (!) 178/87  (!) 172/82 (!) 154/94  Pulse: 87  60   Resp: 17  18   Temp: 97.6 F (36.4 C)  98.8 F (37.1 C)   TempSrc:      SpO2: 96% 91% 96%   Weight:      Height:        General exam: Pt confused at times.  Cooperative.  Appears calm and comfortable  Respiratory system: occasional coarse sound heard posteriorly at left base. Respiratory effort normal. Cardiovascular system: normal S1 & S2 heard. No JVD, murmurs, rubs, gallops or clicks. No pedal edema. Gastrointestinal system: Abdomen is nondistended, soft and nontender. No organomegaly or masses felt. Normal bowel sounds heard. Central nervous system: Alert and oriented. No focal neurological deficits. Extremities: Symmetric 5 x 5 power. Skin: No rashes, lesions or ulcers Psychiatry:  Mood & affect appropriate.    The results of significant diagnostics from this hospitalization (including imaging, microbiology, ancillary and laboratory) are listed below for reference.     Microbiology: Recent Results (from the past 240 hour(s))  SARS Coronavirus 2 by RT PCR (hospital order, performed in [facility] hospital lab) Nasopharyngeal Nasopharyngeal Swab     Status: Abnormal   Collection Time: 01/19/20  1:46 AM   Specimen: Nasopharyngeal Swab  Result Value Ref Range Status   SARS Coronavirus 2 POSITIVE (A) NEGATIVE Final    Comment: RESULT  CALLED TO, READ BACK BY AND VERIFIED WITH: T WALKER,RN@0327  01/19/20 MKELLY (NOTE) SARS-CoV-2 target nucleic acids are DETECTED  SARS-CoV-2 RNA is generally detectable in upper respiratory specimens  during the acute phase of infection.  Positive results are indicative  of the presence of the identified virus, but do not rule out bacterial infection or co-infection with other pathogens not detected by the test.  Clinical correlation with patient history and  other diagnostic information is necessary to determine patient infection status.  The expected result is negative.  Fact Sheet for Patients:   StrictlyIdeas.no   Fact Sheet  for Healthcare Providers:   BankingDealers.co.za    This test is not yet approved or cleared by the [place] and  has been authorized for detection and/or diagnosis of SARS-CoV-2 by FDA under an Emergency Use Authorization (EUA).  This EUA will remain in effect (meaning this test  can be used) for the duration of  the COVID-19 declaration under Section 564(b)(1) of the Act, 21 U.S.C. section 360-bbb-3(b)(1), unless the authorization is terminated or revoked sooner.  Performed at [facility], [address] [place], [place] [address]      Labs: BNP (last 3 results) Recent Labs    01/18/20 2300  BNP 657.8*   Basic Metabolic Panel: Recent Labs  Lab 01/18/20 2300 01/20/20 0706 01/21/20 0627 01/22/20 0649 01/23/20 0638  NA 139 139 139 141 139  K 2.9* 3.4* 4.0 3.8 3.7  CL 102 106 104 106 104  CO2 26 24 25 23 25   GLUCOSE 135* 164* 163* 172* 184*  BUN 24* 22 31* 31* 35*  CREATININE 1.38* 0.93 0.95 0.94 1.05  CALCIUM 8.3* 8.2* 8.7* 8.6* 8.2*  MG  --  1.9 2.2 2.1 2.1  PHOS  --  2.0* 2.6 1.8* 2.5   Liver Function Tests: Recent Labs  Lab 01/18/20 2300 01/20/20 0706 01/21/20 0627 01/22/20 0649 01/23/20 0638  AST 22 19 20  60* 79*  ALT 16 14 16  43 94*  ALKPHOS 53 46 48 48 46  BILITOT  1.3* 0.9 0.6 0.9 1.0  PROT 6.7 6.0* 6.4* 6.2* 5.9*  ALBUMIN 3.4* 2.9* 3.1* 3.0* 2.9*   No results for input(s): LIPASE, AMYLASE in the last 168 hours. Recent Labs  Lab 01/18/20 2300  AMMONIA 9   CBC: Recent Labs  Lab 01/18/20 2300 01/20/20 0706 01/21/20 0627 01/22/20 0649 01/23/20 0638  WBC 7.9 9.0 14.0* 14.9* 13.5*  NEUTROABS 5.9 7.8* 12.3* 13.7* 12.6*  HGB 13.7 13.4 13.9 13.8 13.0  HCT 42.9 41.5 43.5 42.3 40.2  MCV 88.1 86.5 87.3 86.3 86.6  PLT 171 204 258 264 288   Cardiac Enzymes: No results for input(s): CKTOTAL, CKMB, CKMBINDEX, TROPONINI in the last 168 hours. BNP: Invalid input(s): POCBNP CBG: No results for input(s): GLUCAP in the last 168 hours. D-Dimer Recent Labs    01/22/20 0649 01/23/20 0638  DDIMER 3.89* 3.78*   Hgb A1c No results for input(s): HGBA1C in the last 72 hours. Lipid Profile No results for input(s): CHOL, HDL, LDLCALC, TRIG, CHOLHDL, LDLDIRECT in the last 72 hours. Thyroid function studies No results for input(s): TSH, T4TOTAL, T3FREE, THYROIDAB in the last 72 hours.  Invalid input(s): FREET3 Anemia work up Recent Labs    01/22/20 0649 01/23/20 0638  FERRITIN 354* 376*   Urinalysis    Component Value Date/Time   COLORURINE YELLOW 01/18/2020 2212   APPEARANCEUR HAZY (A) 01/18/2020 2212   LABSPEC 1.019 01/18/2020 2212   PHURINE 6.0 01/18/2020 2212   GLUCOSEU NEGATIVE 01/18/2020 2212   HGBUR NEGATIVE 01/18/2020 2212   BILIRUBINUR NEGATIVE 01/18/2020 2212   KETONESUR 5 (A) 01/18/2020 2212   PROTEINUR 100 (A) 01/18/2020 2212   UROBILINOGEN 0.2 07/23/2009 1659   NITRITE NEGATIVE 01/18/2020 2212   LEUKOCYTESUR NEGATIVE 01/18/2020 2212   Sepsis Labs Invalid input(s): PROCALCITONIN,  WBC,  LACTICIDVEN Microbiology Recent Results (from the past 240 hour(s))  SARS Coronavirus 2 by RT PCR (hospital order, performed in [place] hospital lab) Nasopharyngeal Nasopharyngeal Swab     Status: Abnormal   Collection Time: 01/19/20   1:46 AM  Specimen: Nasopharyngeal Swab  Result Value Ref Range Status   SARS Coronavirus 2 POSITIVE (A) NEGATIVE Final    Comment: RESULT CALLED TO, READ BACK BY AND VERIFIED WITH: T WALKER,RN@0327  01/19/20 MKELLY (NOTE) SARS-CoV-2 target nucleic acids are DETECTED  SARS-CoV-2 RNA is generally detectable in upper respiratory specimens  during the acute phase of infection.  Positive results are indicative  of the presence of the identified virus, but do not rule out bacterial infection or co-infection with other pathogens not detected by the test.  Clinical correlation with patient history and  other diagnostic information is necessary to determine patient infection status.  The expected result is negative.  Fact Sheet for Patients:   StrictlyIdeas.no   Fact Sheet for Healthcare Providers:   BankingDealers.co.za    This test is not yet approved or cleared by the [place] FDA and  has been authorized for detection and/or diagnosis of SARS-CoV-2 by FDA under an Emergency Use Authorization (EUA).  This EUA will remain in effect (meaning this test  can be used) for the duration of  the COVID-19 declaration under Section 564(b)(1) of the Act, 21 U.S.C. section 360-bbb-3(b)(1), unless the authorization is terminated or revoked sooner.  Performed at [facility], [address] [place], [place] [address]    Time coordinating discharge: 35 minutes  SIGNED:  Irwin Brakeman, MD How to contact the [facility] Attending or Consulting provider [place] or covering provider during after hours [place], for this patient?  1. Check the care team in [facility] and look for a) attending/consulting TRH provider listed and b) the [facility] team listed 2. Log into [email or web address] and use [place]'s universal password to access. If you do not have the password, please contact the hospital operator. 3. Locate the [facility] provider you are looking for under [facility] and page to a number that you can be directly reached. 4. If you still have difficulty reaching the provider, please page the [facility] (Director on Call) for the Hospitalists listed on amion for assistance.  01/23/2020, 2:29 PM  If 7PM-7AM, please contact night-coverage [email or web address]

## 2020-01-20 NOTE — TOC Progression Note (Signed)
Transition of Care [facility]) - Progression Note    Patient Details  Name: LYNKIN SAINI MRN: [id number] Date of Birth: 1941/04/25  Transition of [facility]) CM/SW Contact  Natasha Bence, LCSW Phone Number: 01/20/2020, 4:39 PM  Clinical Narrative:    CSW received SNF referral for patient. CSW contacted family to identify SNF placements to send referral to. CSW faxed referral to local SNFs. Local SNFs not able to take patient due to Covid + status. CSW faxed patient to facilities with Covid specific Units. [facility] agreeable to provide bed offer. [facility] reported that their bed would not be available until Friday January 24 2020. Patient continues to meet in patient criteria according to MD. [facility] agreeable to hold bed for patient. Family agreeable to [facility] SNF placement. Family inquired about long term care facilities. CSW placed referral to the financial counselor [facility]. Tito Dine reported that patient has medicaid as a Consulting civil engineer. CSW consulted family and informed them that they are able utilize patient's medicaid to fund long term care. CSW also informed family that the social worker provided at the identified SNF placement would be able to assist them with the process. TOC to follow.         Expected Discharge Plan and Services           Expected Discharge Date: 01/20/20                                     Social Determinants of Health (SDOH) Interventions    Readmission Risk Interventions No flowsheet data found.

## 2020-01-20 NOTE — Plan of Care (Signed)
  Problem: Activity: Goal: Risk for activity intolerance will decrease Outcome: Progressing   Problem: Nutrition: Goal: Adequate nutrition will be maintained Outcome: Progressing   Problem: Education: Goal: Knowledge of risk factors and measures for prevention of condition will improve Outcome: Not Progressing   Problem: Safety: Goal: Ability to remain free from injury will improve Outcome: Not Progressing

## 2020-01-20 NOTE — Evaluation (Signed)
Physical Therapy Evaluation Patient Details Name: Henry Allen MRN: [id number] DOB: Oct 04, 1940 Today's Date: 01/20/2020   History of Present Illness  Henry Allen is a 79 y.o. male with medical history significant of repaired AAA, CAD/CABG, elevated PSA, history of hemorrhagic stroke, hyperlipidemia, hypertension, renal artery stenosis, ventral hernia who is coming to the emergency department via EMS after he was found in bed by a family member with blood on his arm and face, but neither the patient or family did not know exactly how things happen. His daughter spoke to Dr. Sabra Heck earlier and stated that he has been having progressive mild confusion. He is unable to provide further information as he continued to be confused oriented to name, partially oriented to place, disoriented to time and situation. He denies having any headaches, chest, back or abdominal pain at this time. He has some discomfort on his arm and nose area.    Clinical Impression  Patient had difficulty for bed mobility due to c/o discomfort in abdomen due to hernia, demonstrates slow labored cadence during ambulation with occasional leaning on nearby objects for support, limited secondary to fatigue and continued sitting up in chair after therapy.  Patient will benefit from continued physical therapy in hospital and recommended venue below to increase strength, balance, endurance for safe ADLs and gait.    Follow Up Recommendations SNF;Supervision for mobility/OOB;Supervision - Intermittent    Equipment Recommendations  None recommended by PT    Recommendations for Other Services       Precautions / Restrictions Precautions Precautions: Fall Restrictions Weight Bearing Restrictions: No      Mobility  Bed Mobility Overal bed mobility: Needs Assistance Bed Mobility: Supine to Sit;Sit to Supine     Supine to sit: Min guard Sit to supine: Min guard   General bed mobility comments: slow labored movement with  difficulty completing supine to sitting due to hernia  Transfers Overall transfer level: Needs assistance Equipment used: 1 person hand held assist;None Transfers: Sit to/from [facility] Sit to Stand: Min guard Stand pivot transfers: Min guard       General transfer comment: slow labored movement  Ambulation/Gait Ambulation/Gait assistance: Min guard;Min assist Gait Distance (Feet): 20 Feet Assistive device: None;1 person hand held assist Gait Pattern/deviations: Decreased step length - right;Decreased step length - left;Decreased stride length Gait velocity: decreased   General Gait Details: slow labored cadence with occasional leaning on nearby objects for support due to weakness, fair/poor standing balance  Stairs            Wheelchair Mobility    Modified Rankin (Stroke Patients Only)       Balance Overall balance assessment: Needs assistance Sitting-balance support: Feet supported;No upper extremity supported Sitting balance-Leahy Scale: Good Sitting balance - Comments: seated at EOB   Standing balance support: During functional activity;No upper extremity supported Standing balance-Leahy Scale: Poor Standing balance comment: fair/poor without AD                             Pertinent Vitals/Pain Pain Assessment: No/denies pain    Home Living Family/patient expects to be discharged to:: Private residence Living Arrangements: Alone Available Help at Discharge: Family;Available PRN/intermittently Type of Home: Mobile home Home Access: Stairs to enter Entrance Stairs-Rails: None Entrance Stairs-Number of Steps: 2 Home Layout: One level Home Equipment: None      Prior Function Level of Independence: Independent         Comments:  Hydrographic surveyor, drives     Journalist, newspaper        Extremity/Trunk Assessment   Upper Extremity Assessment Upper Extremity Assessment: Overall WFL for tasks assessed    Lower  Extremity Assessment Lower Extremity Assessment: Generalized weakness    Cervical / Trunk Assessment Cervical / Trunk Assessment: Normal  Communication   Communication: No difficulties  Cognition Arousal/Alertness: Awake/alert Behavior During Therapy: WFL for tasks assessed/performed Overall Cognitive Status: History of cognitive impairments - at baseline                                        General Comments      Exercises     Assessment/Plan    PT Assessment Patient needs continued PT services  PT Problem List Decreased strength;Decreased activity tolerance;Decreased mobility;Decreased balance       PT Treatment Interventions Gait training;Stair training;Functional mobility training;Therapeutic activities;Patient/family education;Therapeutic exercise;DME instruction    PT Goals (Current goals can be found in the Care Plan section)  Acute Rehab PT Goals Patient Stated Goal: return home with family to assist PT Goal Formulation: With patient Time For Goal Achievement: 02/03/20 Potential to Achieve Goals: Good    Frequency Min 2X/week   Barriers to discharge        Co-evaluation               AM-PAC PT "6 Clicks" Mobility  Outcome Measure Help needed turning from your back to your side while in a flat bed without using bedrails?: A Little Help needed moving from lying on your back to sitting on the side of a flat bed without using bedrails?: A Little Help needed moving to and from a bed to a chair (including a wheelchair)?: A Little Help needed standing up from a chair using your arms (e.g., wheelchair or bedside chair)?: A Little Help needed to walk in hospital room?: A Lot Help needed climbing 3-5 steps with a railing? : A Lot 6 Click Score: 16    End of Session   Activity Tolerance: Patient tolerated treatment well;Patient limited by fatigue Patient left: in chair;with call bell/phone within reach Nurse Communication: Mobility  status PT Visit Diagnosis: Unsteadiness on feet (R26.81);Other abnormalities of gait and mobility (R26.89);Muscle weakness (generalized) (M62.81);History of falling (Z91.81)    Time: 1001-1020 PT Time Calculation (min) (ACUTE ONLY): 19 min   Charges:   PT Evaluation $PT Eval Low Complexity: 1 Low PT Treatments $Therapeutic Activity: 8-22 mins        12:12 PM, 01/20/20 Lonell Grandchild, MPT Physical Therapist with [facility] [phone] office [phone] mobile phone

## 2020-01-20 NOTE — NC FL2 (Signed)
[place] LEVEL OF CARE SCREENING TOOL     IDENTIFICATION  Patient Name: Henry Allen Birthdate: January 13, 1941 Sex: male Admission Date (Current Location): 01/18/2020  [facility] and [place] Number:  [employer] and Address:  [place] [address] [place]      Provider Number: [id number]  Attending Physician Name and Address:  Rodena Goldmann, DO  Relative Name and Phone Number:  Matthe Sloane   [phone]    Current Level of Care: Hospital Recommended Level of Care: [place] Prior Approval Number:    Date Approved/Denied: 01/20/20 PASRR Number: [id number]  Discharge Plan: SNF    Current Diagnoses: Patient Active Problem List   Diagnosis Date Noted  . AMS (altered mental status) 01/19/2020  . 2019 novel coronavirus disease (COVID-19) 01/19/2020  . Hyperlipemia   . Hypertension   . Symptomatic bradycardia   . Hypokalemia   . Elevated troponin   . Tobacco use   . CKD (chronic kidney disease) stage 3, GFR 30-59 ml/min   . Vascular dementia ([place])   . CAD (coronary artery disease) 2009    Orientation RESPIRATION BLADDER Height & Weight     Self, Time, Place  Normal Continent Weight: 187 lb (84.8 kg) Height:  5' 8.5" (174 cm)  BEHAVIORAL SYMPTOMS/MOOD NEUROLOGICAL BOWEL NUTRITION STATUS      Continent Diet (Diet Heart Room service appropriate? Yes; Fluid consistency: Thin)  AMBULATORY STATUS COMMUNICATION OF NEEDS Skin   Supervision Verbally Normal                       Personal Care Assistance Level of Assistance  Bathing, Feeding, Dressing Bathing Assistance: Limited assistance Feeding assistance: Independent Dressing Assistance: Limited assistance     Functional Limitations Info  Sight, Hearing, Speech Sight Info: Adequate Hearing Info: Adequate Speech Info: Adequate    SPECIAL CARE FACTORS FREQUENCY  PT (By licensed PT)     PT Frequency: 5x per week               Contractures Contractures Info: Not present    Additional Factors Info  Code Status, Allergies Code Status Info: DNR Allergies Info: Morphine           Current Medications (01/20/2020):  This is the current hospital active medication list Current Facility-Administered Medications  Medication Dose Route Frequency Provider Last Rate Last Admin  . acetaminophen (TYLENOL) tablet 650 mg  650 mg Oral Q6H PRN Reubin Milan, MD       Or  . acetaminophen (TYLENOL) suppository 650 mg  650 mg Rectal Q6H PRN Reubin Milan, MD      . albuterol (VENTOLIN HFA) 108 (90 Base) MCG/ACT inhaler 2 puff  2 puff Inhalation Q6H PRN Manuella Ghazi, Pratik D, DO      . amLODipine (NORVASC) tablet 10 mg  10 mg Oral Daily Reubin Milan, MD   10 mg at 01/20/20 0846  . ascorbic acid (VITAMIN C) tablet 500 mg  500 mg Oral Daily Reubin Milan, MD   500 mg at 01/20/20 0845  . aspirin EC tablet 81 mg  81 mg Oral Daily Reubin Milan, MD   81 mg at 01/20/20 0846  . enoxaparin (LOVENOX) injection 40 mg  40 mg Subcutaneous Q24H Reubin Milan, MD   40 mg at 01/20/20 0846  . guaiFENesin-dextromethorphan (ROBITUSSIN DM) 100-10 MG/5ML syrup 10 mL  10 mL Oral Q4H PRN Reubin Milan, MD      .  lisinopril (ZESTRIL) tablet 40 mg  40 mg Oral BID Reubin Milan, MD   40 mg at 01/20/20 0845  . methylPREDNISolone sodium succinate (SOLU-MEDROL) 125 mg/2 mL injection 42.5 mg  0.5 mg/kg Intravenous Q12H Reubin Milan, MD   42.5 mg at 01/20/20 0846  . ondansetron (ZOFRAN) tablet 4 mg  4 mg Oral Q6H PRN Reubin Milan, MD       Or  . ondansetron [facility]) injection 4 mg  4 mg Intravenous Q6H PRN Reubin Milan, MD      . remdesivir 100 mg in sodium chloride 0.9 % 100 mL IVPB  100 mg Intravenous Daily Reubin Milan, MD 200 mL/hr at 01/20/20 0855 100 mg at 01/20/20 0855  . zinc sulfate capsule 220 mg  220 mg Oral Daily Reubin Milan, MD   220 mg at 01/20/20 0845      Discharge Medications: Please see discharge summary for a list of discharge medications.  Relevant Imaging Results:  Relevant Lab Results:   Additional Information Pt SSN: [id number]  Natasha Bence, LCSW

## 2020-01-20 NOTE — Plan of Care (Signed)
  Problem: Acute Rehab PT Goals(only PT should resolve) Goal: Pt Will Go Supine/Side To Sit Outcome: Progressing Flowsheets (Taken 01/20/2020 1216) Pt will go Supine/Side to Sit: with supervision Goal: Patient Will Transfer Sit To/From Stand Outcome: Progressing Flowsheets (Taken 01/20/2020 1216) Patient will transfer sit to/from stand: with supervision Goal: Pt Will Transfer Bed To Chair/Chair To Bed Outcome: Progressing Flowsheets (Taken 01/20/2020 1216) Pt will Transfer Bed to Chair/Chair to Bed: with supervision Goal: Pt Will Ambulate Outcome: Progressing Flowsheets (Taken 01/20/2020 1216) Pt will Ambulate:  50 feet  with supervision   12:17 PM, 01/20/20 Lonell Grandchild, MPT Physical Therapist with [facility] [phone] office [phone] mobile phone

## 2020-01-21 LAB — CBC WITH DIFFERENTIAL/PLATELET
Abs Immature Granulocytes: 0.25 10*3/uL — ABNORMAL HIGH (ref 0.00–0.07)
Basophils Absolute: 0 10*3/uL (ref 0.0–0.1)
Basophils Relative: 0 %
Eosinophils Absolute: 0 10*3/uL (ref 0.0–0.5)
Eosinophils Relative: 0 %
HCT: 43.5 % (ref 39.0–52.0)
Hemoglobin: 13.9 g/dL (ref 13.0–17.0)
Immature Granulocytes: 2 %
Lymphocytes Relative: 6 %
Lymphs Abs: 0.8 10*3/uL (ref 0.7–4.0)
MCH: 27.9 pg (ref 26.0–34.0)
MCHC: 32 g/dL (ref 30.0–36.0)
MCV: 87.3 fL (ref 80.0–100.0)
Monocytes Absolute: 0.7 10*3/uL (ref 0.1–1.0)
Monocytes Relative: 5 %
Neutro Abs: 12.3 10*3/uL — ABNORMAL HIGH (ref 1.7–7.7)
Neutrophils Relative %: 87 %
Platelets: 258 10*3/uL (ref 150–400)
RBC: 4.98 MIL/uL (ref 4.22–5.81)
RDW: 13.9 % (ref 11.5–15.5)
WBC: 14 10*3/uL — ABNORMAL HIGH (ref 4.0–10.5)
nRBC: 0 % (ref 0.0–0.2)

## 2020-01-21 LAB — MAGNESIUM: Magnesium: 2.2 mg/dL (ref 1.7–2.4)

## 2020-01-21 LAB — D-DIMER, QUANTITATIVE: D-Dimer, Quant: 8.19 ug/mL-FEU — ABNORMAL HIGH (ref 0.00–0.50)

## 2020-01-21 LAB — COMPREHENSIVE METABOLIC PANEL
ALT: 16 U/L (ref 0–44)
AST: 20 U/L (ref 15–41)
Albumin: 3.1 g/dL — ABNORMAL LOW (ref 3.5–5.0)
Alkaline Phosphatase: 48 U/L (ref 38–126)
Anion gap: 10 (ref 5–15)
BUN: 31 mg/dL — ABNORMAL HIGH (ref 8–23)
CO2: 25 mmol/L (ref 22–32)
Calcium: 8.7 mg/dL — ABNORMAL LOW (ref 8.9–10.3)
Chloride: 104 mmol/L (ref 98–111)
Creatinine, Ser: 0.95 mg/dL (ref 0.61–1.24)
GFR calc Af Amer: >60 mL/min (ref >60)
GFR calc non Af Amer: >60 mL/min (ref >60)
Glucose, Bld: 163 mg/dL — ABNORMAL HIGH (ref 70–99)
Potassium: 4 mmol/L (ref 3.5–5.1)
Sodium: 139 mmol/L (ref 135–145)
Total Bilirubin: 0.6 mg/dL (ref 0.3–1.2)
Total Protein: 6.4 g/dL — ABNORMAL LOW (ref 6.5–8.1)

## 2020-01-21 LAB — FERRITIN: Ferritin: 434 ng/mL — ABNORMAL HIGH (ref 24–336)

## 2020-01-21 LAB — PHOSPHORUS: Phosphorus: 2.6 mg/dL (ref 2.5–4.6)

## 2020-01-21 LAB — C-REACTIVE PROTEIN: CRP: 6.1 mg/dL — ABNORMAL HIGH (ref <1.0)

## 2020-01-21 MED ORDER — ENOXAPARIN SODIUM 40 MG/0.4ML ~~LOC~~ SOLN
40.0000 mg | Freq: Two times a day (BID) | SUBCUTANEOUS | Status: DC
  Administered 2020-01-21 – 2020-01-23 (×4): 40 mg via SUBCUTANEOUS
  Filled 2020-01-21 (×4): qty 0.4

## 2020-01-21 NOTE — Progress Notes (Signed)
Patient seen and evaluated this morning with no complaints or concerns noted.  He appears to be resting comfortably.  Please refer to discharge summary dictated 01/20/2020 regarding need for SNF placement.  Continue current treatment for Covid pneumonia with remdesivir and dexamethasone as prescribed.  D-dimer noted to be elevated at 8.19 today and therefore I will increase Lovenox to 0.5 mg/kg dosing.  CRP levels are downtrending to 6.1 this morning.  Continue to follow inflammatory markers.  CSW with last update that patient will be able to go to [facility] by 01/24/2020.  Tried calling daughter Kenn File with no response today. Last updated 8/30.  Total care time: 15 minutes.

## 2020-01-22 DIAGNOSIS — E876 Hypokalemia: Secondary | ICD-10-CM

## 2020-01-22 DIAGNOSIS — R4182 Altered mental status, unspecified: Secondary | ICD-10-CM

## 2020-01-22 DIAGNOSIS — N183 Chronic kidney disease, stage 3 unspecified: Secondary | ICD-10-CM

## 2020-01-22 DIAGNOSIS — I10 Essential (primary) hypertension: Secondary | ICD-10-CM

## 2020-01-22 DIAGNOSIS — F0150 Vascular dementia without behavioral disturbance: Secondary | ICD-10-CM

## 2020-01-22 LAB — CBC WITH DIFFERENTIAL/PLATELET
Basophils Absolute: 0 10*3/uL (ref 0.0–0.1)
Basophils Relative: 0 %
Eosinophils Absolute: 0 10*3/uL (ref 0.0–0.5)
Eosinophils Relative: 0 %
HCT: 42.3 % (ref 39.0–52.0)
Hemoglobin: 13.8 g/dL (ref 13.0–17.0)
Lymphocytes Relative: 3 %
Lymphs Abs: 0.4 10*3/uL — ABNORMAL LOW (ref 0.7–4.0)
MCH: 28.2 pg (ref 26.0–34.0)
MCHC: 32.6 g/dL (ref 30.0–36.0)
MCV: 86.3 fL (ref 80.0–100.0)
Monocytes Absolute: 0.7 10*3/uL (ref 0.1–1.0)
Monocytes Relative: 5 %
Neutro Abs: 13.7 10*3/uL — ABNORMAL HIGH (ref 1.7–7.7)
Neutrophils Relative %: 92 %
Platelets: 264 10*3/uL (ref 150–400)
RBC: 4.9 MIL/uL (ref 4.22–5.81)
RDW: 14.1 % (ref 11.5–15.5)
WBC: 14.9 10*3/uL — ABNORMAL HIGH (ref 4.0–10.5)
nRBC: 0 % (ref 0.0–0.2)

## 2020-01-22 LAB — COMPREHENSIVE METABOLIC PANEL
ALT: 43 U/L (ref 0–44)
AST: 60 U/L — ABNORMAL HIGH (ref 15–41)
Albumin: 3 g/dL — ABNORMAL LOW (ref 3.5–5.0)
Alkaline Phosphatase: 48 U/L (ref 38–126)
Anion gap: 12 (ref 5–15)
BUN: 31 mg/dL — ABNORMAL HIGH (ref 8–23)
CO2: 23 mmol/L (ref 22–32)
Calcium: 8.6 mg/dL — ABNORMAL LOW (ref 8.9–10.3)
Chloride: 106 mmol/L (ref 98–111)
Creatinine, Ser: 0.94 mg/dL (ref 0.61–1.24)
GFR calc Af Amer: >60 mL/min (ref >60)
GFR calc non Af Amer: >60 mL/min (ref >60)
Glucose, Bld: 172 mg/dL — ABNORMAL HIGH (ref 70–99)
Potassium: 3.8 mmol/L (ref 3.5–5.1)
Sodium: 141 mmol/L (ref 135–145)
Total Bilirubin: 0.9 mg/dL (ref 0.3–1.2)
Total Protein: 6.2 g/dL — ABNORMAL LOW (ref 6.5–8.1)

## 2020-01-22 LAB — MAGNESIUM: Magnesium: 2.1 mg/dL (ref 1.7–2.4)

## 2020-01-22 LAB — FERRITIN: Ferritin: 354 ng/mL — ABNORMAL HIGH (ref 24–336)

## 2020-01-22 LAB — PHOSPHORUS: Phosphorus: 1.8 mg/dL — ABNORMAL LOW (ref 2.5–4.6)

## 2020-01-22 LAB — C-REACTIVE PROTEIN: CRP: 2.6 mg/dL — ABNORMAL HIGH (ref <1.0)

## 2020-01-22 LAB — D-DIMER, QUANTITATIVE: D-Dimer, Quant: 3.89 ug/mL-FEU — ABNORMAL HIGH (ref 0.00–0.50)

## 2020-01-22 MED ORDER — HYDRALAZINE HCL 20 MG/ML IJ SOLN
10.0000 mg | Freq: Four times a day (QID) | INTRAMUSCULAR | Status: DC | PRN
  Administered 2020-01-22: 10 mg via INTRAVENOUS
  Filled 2020-01-22: qty 1

## 2020-01-22 NOTE — Care Management Important Message (Signed)
Important Message  Patient Details  Name: Henry Allen MRN: [id number] Date of Birth: 1941-02-12   Medicare Important Message Given:  Yes - Important Message mailed due to current National Emergency     Tommy Medal 01/22/2020, 11:52 AM

## 2020-01-22 NOTE — Progress Notes (Signed)
PROGRESS NOTE   Henry Allen  WER:[id number] DOB: [birth year] DOA: 01/18/2020 PCP: Redmond School, MD   Chief Complaint  Patient presents with  . Altered Mental Status    Brief Admission History:  79 y.o.malewith medical history significant ofrepaired AAA, CAD/CABG, elevated PSA, history of hemorrhagic stroke, hyperlipidemia, hypertension, renal artery stenosis, ventral herniawho is coming to the emergency department via EMS after he was found in bed by a family member with blood on his arm and face, but neither the patient or familydid not know exactly how things happen.Hisdaughter spoke to Dr. Sabra Heck earlier and stated that he has been having progressive mild confusion. He is unable to provide further information as he continued to be confused oriented to name, partially oriented to place, disoriented to time and situation. He denies having any headaches, chest, back or abdominal pain at this time. He has some discomfort on his arm and nose area.  Assessment & Plan:   Principal Problem:   2019 novel coronavirus disease (COVID-19) Active Problems:   AMS (altered mental status)   CAD (coronary artery disease)   Hyperlipemia   Hypertension   Symptomatic bradycardia   Hypokalemia   Elevated troponin   Tobacco use   CKD (chronic kidney disease) stage 3, GFR 30-59 ml/min   Vascular dementia ([place])  Covid Pneumonia - Pt is being treated with steroids and remdesivir and slowly he is improving.  He is currently awaiting final remdesivir treatment 9/2 and plan is for him to go to SNF rehab.    DVT prophylaxis: lovenox Code Status: full  Family Communication:   Disposition:  SNF   Status is: Inpatient  Remains inpatient appropriate because:IV treatments appropriate due to intensity of illness or inability to take PO.  SNF will not take patient until he has completed all remdesivir doses.   Dispo: The patient is from: Home              Anticipated d/c is to: SNF               Anticipated d/c date is: 1 day              Patient currently is medically stable to d/c.  Consultants:     Procedures:     Antimicrobials:    Subjective: Pt reports that he wants to go home.  He wants out of the hospital.  No other complaints except for his breakfast food tray items.    Objective: Vitals:   01/21/20 1400 01/21/20 2146 01/22/20 0556 01/22/20 1036  BP: (!) 154/91 (!) 184/87 (!) 193/82 (!) 191/81  Pulse: 61 68 71 69  Resp: 17 20 16 18   Temp: 97.9 F (36.6 C) 98.1 F (36.7 C) (!) 97.5 F (36.4 C) 97.9 F (36.6 C)  TempSrc: Oral   Oral  SpO2: 91% 94% 93% 95%  Weight:      Height:        Intake/Output Summary (Last 24 hours) at 01/22/2020 1259 Last data filed at 01/21/2020 2100 Gross per 24 hour  Intake 240 ml  Output 150 ml  Net 90 ml   Filed Weights   01/18/20 2059  Weight: 84.8 kg    Examination:  General exam: Pt confused at times.  Cooperative.  Appears calm and comfortable  Respiratory system: occasional coarse sound heard posteriorly at left base. Respiratory effort normal. Cardiovascular system: normal S1 & S2 heard. No JVD, murmurs, rubs, gallops or clicks. No pedal edema. Gastrointestinal system: Abdomen is nondistended, soft  and nontender. No organomegaly or masses felt. Normal bowel sounds heard. Central nervous system: Alert and oriented. No focal neurological deficits. Extremities: Symmetric 5 x 5 power. Skin: No rashes, lesions or ulcers Psychiatry: Judgement and insight appear normal. Mood & affect appropriate.   Data Reviewed: I have personally reviewed following labs and imaging studies  CBC: Recent Labs  Lab 01/18/20 2300 01/20/20 0706 01/21/20 0627 01/22/20 0649  WBC 7.9 9.0 14.0* 14.9*  NEUTROABS 5.9 7.8* 12.3* 13.7*  HGB 13.7 13.4 13.9 13.8  HCT 42.9 41.5 43.5 42.3  MCV 88.1 86.5 87.3 86.3  PLT 171 204 258 702    Basic Metabolic Panel: Recent Labs  Lab 01/18/20 2300 01/20/20 0706 01/21/20 0627 01/22/20 0649   NA 139 139 139 141  K 2.9* 3.4* 4.0 3.8  CL 102 106 104 106  CO2 26 24 25 23   GLUCOSE 135* 164* 163* 172*  BUN 24* 22 31* 31*  CREATININE 1.38* 0.93 0.95 0.94  CALCIUM 8.3* 8.2* 8.7* 8.6*  MG  --  1.9 2.2 2.1  PHOS  --  2.0* 2.6 1.8*    GFR: Estimated Creatinine Clearance: 68.2 mL/min (by C-G formula based on SCr of 0.94 mg/dL).  Liver Function Tests: Recent Labs  Lab 01/18/20 2300 01/20/20 0706 01/21/20 0627 01/22/20 0649  AST 22 19 20  60*  ALT 16 14 16  43  ALKPHOS 53 46 48 48  BILITOT 1.3* 0.9 0.6 0.9  PROT 6.7 6.0* 6.4* 6.2*  ALBUMIN 3.4* 2.9* 3.1* 3.0*    CBG: No results for input(s): GLUCAP in the last 168 hours.  Recent Results (from the past 240 hour(s))  SARS Coronavirus 2 by RT PCR (hospital order, performed in [facility] hospital lab) Nasopharyngeal Nasopharyngeal Swab     Status: Abnormal   Collection Time: 01/19/20  1:46 AM   Specimen: Nasopharyngeal Swab  Result Value Ref Range Status   SARS Coronavirus 2 POSITIVE (A) NEGATIVE Final    Comment: RESULT CALLED TO, READ BACK BY AND VERIFIED WITH: T WALKER,RN@[id number]  01/19/20 MKELLY (NOTE) SARS-CoV-2 target nucleic acids are DETECTED  SARS-CoV-2 RNA is generally detectable in upper respiratory specimens  during the acute phase of infection.  Positive results are indicative  of the presence of the identified virus, but do not rule out bacterial infection or co-infection with other pathogens not detected by the test.  Clinical correlation with patient history and  other diagnostic information is necessary to determine patient infection status.  The expected result is negative.  Fact Sheet for Patients:   StrictlyIdeas.no   Fact Sheet for Healthcare Providers:   BankingDealers.co.za    This test is not yet approved or cleared by the [place] FDA and  has been authorized for detection and/or diagnosis of SARS-CoV-2 by FDA under an Emergency Use  Authorization (EUA).  This EUA will remain in effect (meaning this test  can be used) for the duration of  the COVID-19 declaration under Section 564(b)(1) of the Act, 21 U.S.C. section 360-bbb-3(b)(1), unless the authorization is terminated or revoked sooner.  Performed at [facility], [address] [place], [place] [address]      Radiology Studies: No results found.  Scheduled Meds: . amLODipine  10 mg Oral Daily  . vitamin C  500 mg Oral Daily  . aspirin EC  81 mg Oral Daily  . enoxaparin (LOVENOX) injection  40 mg Subcutaneous Q12H  . lisinopril  40 mg Oral BID  . methylPREDNISolone (SOLU-MEDROL) injection  0.5 mg/kg Intravenous Q12H  .  zinc sulfate  220 mg Oral Daily   Continuous Infusions: . remdesivir 100 mg in NS 100 mL 100 mg (01/22/20 1046)     LOS: 3 days   Time spent: 30 mins  Briahna Pescador Wynetta Emery, MD How to contact the  [facility] Attending or Consulting provider [place] or covering provider during after hours [place], for this patient?  1. Check the care team in [facility] and look for a) attending/consulting TRH provider listed and b) the [facility] team listed 2. Log into [email or web address] and use [place]'s universal password to access. If you do not have the password, please contact the hospital operator. 3. Locate the [facility] provider you are looking for under [facility] and page to a number that you can be directly reached. 4. If you still have difficulty reaching the provider, please page the [facility] (Director on Call) for the Hospitalists listed on amion for assistance.  01/22/2020, 12:59 PM

## 2020-01-23 LAB — CBC WITH DIFFERENTIAL/PLATELET
Basophils Absolute: 0 10*3/uL (ref 0.0–0.1)
Basophils Relative: 0 %
Eosinophils Absolute: 0 10*3/uL (ref 0.0–0.5)
Eosinophils Relative: 0 %
HCT: 40.2 % (ref 39.0–52.0)
Hemoglobin: 13 g/dL (ref 13.0–17.0)
Lymphocytes Relative: 5 %
Lymphs Abs: 0.7 10*3/uL (ref 0.7–4.0)
MCH: 28 pg (ref 26.0–34.0)
MCHC: 32.3 g/dL (ref 30.0–36.0)
MCV: 86.6 fL (ref 80.0–100.0)
Monocytes Absolute: 0.3 10*3/uL (ref 0.1–1.0)
Monocytes Relative: 2 %
Neutro Abs: 12.6 10*3/uL — ABNORMAL HIGH (ref 1.7–7.7)
Neutrophils Relative %: 93 %
Platelets: 288 10*3/uL (ref 150–400)
RBC: 4.64 MIL/uL (ref 4.22–5.81)
RDW: 14.2 % (ref 11.5–15.5)
WBC: 13.5 10*3/uL — ABNORMAL HIGH (ref 4.0–10.5)
nRBC: 0.2 % (ref 0.0–0.2)

## 2020-01-23 LAB — PHOSPHORUS: Phosphorus: 2.5 mg/dL (ref 2.5–4.6)

## 2020-01-23 LAB — D-DIMER, QUANTITATIVE: D-Dimer, Quant: 3.78 ug/mL-FEU — ABNORMAL HIGH (ref 0.00–0.50)

## 2020-01-23 LAB — COMPREHENSIVE METABOLIC PANEL
ALT: 94 U/L — ABNORMAL HIGH (ref 0–44)
AST: 79 U/L — ABNORMAL HIGH (ref 15–41)
Albumin: 2.9 g/dL — ABNORMAL LOW (ref 3.5–5.0)
Alkaline Phosphatase: 46 U/L (ref 38–126)
Anion gap: 10 (ref 5–15)
BUN: 35 mg/dL — ABNORMAL HIGH (ref 8–23)
CO2: 25 mmol/L (ref 22–32)
Calcium: 8.2 mg/dL — ABNORMAL LOW (ref 8.9–10.3)
Chloride: 104 mmol/L (ref 98–111)
Creatinine, Ser: 1.05 mg/dL (ref 0.61–1.24)
GFR calc Af Amer: >60 mL/min (ref >60)
GFR calc non Af Amer: >60 mL/min (ref >60)
Glucose, Bld: 184 mg/dL — ABNORMAL HIGH (ref 70–99)
Potassium: 3.7 mmol/L (ref 3.5–5.1)
Sodium: 139 mmol/L (ref 135–145)
Total Bilirubin: 1 mg/dL (ref 0.3–1.2)
Total Protein: 5.9 g/dL — ABNORMAL LOW (ref 6.5–8.1)

## 2020-01-23 LAB — FERRITIN: Ferritin: 376 ng/mL — ABNORMAL HIGH (ref 24–336)

## 2020-01-23 LAB — MAGNESIUM: Magnesium: 2.1 mg/dL (ref 1.7–2.4)

## 2020-01-23 LAB — C-REACTIVE PROTEIN: CRP: 1.3 mg/dL — ABNORMAL HIGH (ref <1.0)

## 2020-01-23 MED ORDER — METOPROLOL TARTRATE 25 MG PO TABS
25.0000 mg | ORAL_TABLET | Freq: Two times a day (BID) | ORAL | Status: DC
  Administered 2020-01-23 – 2020-01-24 (×3): 25 mg via ORAL
  Filled 2020-01-23 (×3): qty 1

## 2020-01-23 MED ORDER — DEXAMETHASONE 6 MG PO TABS: 6.0000 mg | ORAL_TABLET | Freq: Every day | ORAL | 0 refills | Status: AC

## 2020-01-23 MED ORDER — OMEPRAZOLE 20 MG PO CPDR: 20.0000 mg | DELAYED_RELEASE_CAPSULE | Freq: Every day | ORAL | 0 refills | Status: AC

## 2020-01-23 NOTE — TOC Progression Note (Signed)
Transition of Care [facility]) - Progression Note    Patient Details  Name: Henry Allen MRN: [id number] Date of Birth: [birth year]  Transition of Care [facility]) CM/SW Contact  Ihor Gully, LCSW Phone Number: 01/23/2020, 11:36 AM  Clinical Narrative:    Damaris Schooner with Melvenia Beam at [facility], confirmed admission for 01/24/20 to facility.    Expected Discharge Plan: Skilled Nursing Facility Barriers to Discharge: Other (comment) (SNF when bed is available on 9/3)  Expected Discharge Plan and Services Expected Discharge Plan: [place] arrangements for the past 2 months: Single Family Home Expected Discharge Date: 01/20/20                                     Social Determinants of Health (SDOH) Interventions    Readmission Risk Interventions No flowsheet data found.

## 2020-01-23 NOTE — Progress Notes (Signed)
Old guaze dressing removed from right forearm. Revealed large skin tear that pt sustained PTA. Dressing removed with saline to soften old drainage and then cleaned with soap and warm water. Wound bed red, bleeding. Edges dry. Covered with vaseline gauze and foam dressing. Pt also has smaller skin tear noted to underside of right forearm. Again cleaned and dressed with foam.

## 2020-01-23 NOTE — Progress Notes (Signed)
Pt has been up in chair most of afternoon. Has had bath and is in paper scrubs for comfort. Pt A&O x4, talkative. Still issues with short term memory, pt repeats self often and when spoken to by staff, will repeat what they are saying to him. Cooperative, pleasant.

## 2020-01-23 NOTE — Progress Notes (Signed)
Physical Therapy Treatment Patient Details Name: Henry Allen MRN: [id number] DOB: [birth year] Today's Date: 01/23/2020    History of Present Illness Henry Allen is a 79 y.o. male with medical history significant of repaired AAA, CAD/CABG, elevated PSA, history of hemorrhagic stroke, hyperlipidemia, hypertension, renal artery stenosis, ventral hernia who is coming to the emergency department via EMS after he was found in bed by a family member with blood on his arm and face, but neither the patient or family did not know exactly how things happen. His daughter spoke to Dr. Sabra Heck earlier and stated that he has been having progressive mild confusion. He is unable to provide further information as he continued to be confused oriented to name, partially oriented to place, disoriented to time and situation. He denies having any headaches, chest, back or abdominal pain at this time. He has some discomfort on his arm and nose area.    PT Comments    Patient demonstrates increased endurance/distance for taking steps in room without loss of balance, occasional leaning on nearby objects for support and mostly limited due to fatigue.  Patient demonstrates good return for completing BLE ROM/strengthening exercises with demonstration and verbal cues.  Patient tolerated sitting up in chair after therapy with RN in room to give medications.  Patient will benefit from continued physical therapy in hospital and recommended venue below to increase strength, balance, endurance for safe ADLs and gait.     Follow Up Recommendations  SNF;Supervision for mobility/OOB;Supervision - Intermittent     Equipment Recommendations  None recommended by PT    Recommendations for Other Services       Precautions / Restrictions Precautions Precautions: Fall Restrictions Weight Bearing Restrictions: No    Mobility  Bed Mobility Overal bed mobility: Needs Assistance Bed Mobility: Supine to Sit     Supine to  sit: Supervision;Min guard     General bed mobility comments: increased time, labored movement  Transfers Overall transfer level: Needs assistance Equipment used: 1 person hand held assist Transfers: Sit to/from Stand;Stand Pivot Transfers Sit to Stand: Min guard Stand pivot transfers: Min guard       General transfer comment: slightly labored movement  Ambulation/Gait Ambulation/Gait assistance: Min guard Gait Distance (Feet): 40 Feet Assistive device: 1 person hand held assist Gait Pattern/deviations: Decreased step length - right;Decreased step length - left;Decreased stride length Gait velocity: decreased   General Gait Details: slightly labored cadence without loss of balance, limited mostly due to fatigue   Stairs             Wheelchair Mobility    Modified Rankin (Stroke Patients Only)       Balance Overall balance assessment: Needs assistance Sitting-balance support: Feet supported;No upper extremity supported Sitting balance-Leahy Scale: Good Sitting balance - Comments: seated at EOB   Standing balance support: During functional activity;No upper extremity supported Standing balance-Leahy Scale: Fair Standing balance comment: without AD                            Cognition Arousal/Alertness: Awake/alert Behavior During Therapy: WFL for tasks assessed/performed Overall Cognitive Status: History of cognitive impairments - at baseline                                        Exercises General Exercises - Lower Extremity Long Arc Quad: Seated;AROM;Strengthening;Both;15 reps Hip Flexion/Marching: Seated;AROM;Strengthening;Both;15  reps Toe Raises: Seated;AROM;Strengthening;Both;15 reps Heel Raises: Seated;AROM;Strengthening;Both;15 reps    General Comments        Pertinent Vitals/Pain Pain Assessment: No/denies pain    Home Living                      Prior Function            PT Goals (current goals  can now be found in the care plan section) Acute Rehab PT Goals Patient Stated Goal: return home with family to assist PT Goal Formulation: With patient Time For Goal Achievement: 02/03/20 Potential to Achieve Goals: Good Progress towards PT goals: Progressing toward goals    Frequency    Min 2X/week      PT Plan Current plan remains appropriate    Co-evaluation              AM-PAC PT "6 Clicks" Mobility   Outcome Measure  Help needed turning from your back to your side while in a flat bed without using bedrails?: None Help needed moving from lying on your back to sitting on the side of a flat bed without using bedrails?: None Help needed moving to and from a bed to a chair (including a wheelchair)?: A Little Help needed standing up from a chair using your arms (e.g., wheelchair or bedside chair)?: A Little Help needed to walk in hospital room?: A Little Help needed climbing 3-5 steps with a railing? : A Lot 6 Click Score: 19    End of Session   Activity Tolerance: Patient tolerated treatment well;Patient limited by fatigue Patient left: in chair;with call bell/phone within reach;with chair alarm set;with nursing/sitter in room Nurse Communication: Mobility status PT Visit Diagnosis: Unsteadiness on feet (R26.81);Other abnormalities of gait and mobility (R26.89);Muscle weakness (generalized) (M62.81);History of falling (Z91.81)     Time: 4163-8453 PT Time Calculation (min) (ACUTE ONLY): 21 min  Charges:  $Gait Training: 8-22 mins $Therapeutic Exercise: 8-22 mins                     1:53 PM, 01/23/20 Lonell Grandchild, MPT Physical Therapist with [facility] [phone] office [phone] mobile phone

## 2020-01-23 NOTE — Progress Notes (Addendum)
PROGRESS NOTE   Henry Allen  MGQ:[id number] DOB: 07/01/1940 DOA: 01/18/2020 PCP: Redmond School, MD   Chief Complaint  Patient presents with  . Altered Mental Status    Brief Admission History:  79 y.o.malewith medical history significant ofrepaired AAA, CAD/CABG, elevated PSA, history of hemorrhagic stroke, hyperlipidemia, hypertension, renal artery stenosis, ventral herniawho is coming to the emergency department via EMS after he was found in bed by a family member with blood on his arm and face, but neither the patient or familydid not know exactly how things happen.Hisdaughter spoke to Dr. Sabra Heck earlier and stated that he has been having progressive mild confusion. He is unable to provide further information as he continued to be confused oriented to name, partially oriented to place, disoriented to time and situation. He denies having any headaches, chest, back or abdominal pain at this time. He has some discomfort on his arm and nose area.  Assessment & Plan:   Principal Problem:   2019 novel coronavirus disease (COVID-19) Active Problems:   AMS (altered mental status)   CAD (coronary artery disease)   Hyperlipemia   Hypertension   Symptomatic bradycardia   Hypokalemia   Elevated troponin   Tobacco use   CKD (chronic kidney disease) stage 3, GFR 30-59 ml/min   Vascular dementia ([place])  Covid Pneumonia - Pt is being treated with steroids and remdesivir and slowly he is improving.  He is s/p final remdesivir treatment 9/2 and plan is for him to go to SNF rehab.   HTN - BP poorly controlled, added metoprolol 25 mg BID   DVT prophylaxis: lovenox Code Status: full  Family Communication:   Disposition:  SNF   Status is: Inpatient  Remains inpatient appropriate because:IV treatments appropriate due to intensity of illness or inability to take PO.  SNF will not take patient until he has completed all remdesivir doses.   Dispo: The patient is from: Home               Anticipated d/c is to: SNF              Anticipated d/c date is: 1 day              Patient currently is medically stable to d/c.  Consultants:     Procedures:     Antimicrobials:    Subjective: Pt denies any specific complaints.    Objective: Vitals:   01/22/20 1446 01/22/20 2120 01/22/20 2137 01/23/20 1118  BP: (!) 178/87  (!) 172/82 (!) 154/94  Pulse: 87  60   Resp: 17  18   Temp: 97.6 F (36.4 C)  98.8 F (37.1 C)   TempSrc:      SpO2: 96% 91% 96%   Weight:      Height:       No intake or output data in the 24 hours ending 01/23/20 1419 Filed Weights   01/18/20 2059  Weight: 84.8 kg    Examination:  General exam: Pt confused at times.  Cooperative.  Appears calm and comfortable  Respiratory system: occasional coarse sound heard posteriorly at left base. Respiratory effort normal. Cardiovascular system: normal S1 & S2 heard. No JVD, murmurs, rubs, gallops or clicks. No pedal edema. Gastrointestinal system: Abdomen is nondistended, soft and nontender. No organomegaly or masses felt. Normal bowel sounds heard. Central nervous system: Alert and oriented. No focal neurological deficits. Extremities: Symmetric 5 x 5 power. Skin: No rashes, lesions or ulcers Psychiatry: Mood & affect appropriate.  Data Reviewed: I have personally reviewed following labs and imaging studies  CBC: Recent Labs  Lab 01/18/20 2300 01/20/20 0706 01/21/20 0627 01/22/20 0649 01/23/20 0638  WBC 7.9 9.0 14.0* 14.9* 13.5*  NEUTROABS 5.9 7.8* 12.3* 13.7* 12.6*  HGB 13.7 13.4 13.9 13.8 13.0  HCT 42.9 41.5 43.5 42.3 40.2  MCV 88.1 86.5 87.3 86.3 86.6  PLT 171 204 258 264 606    Basic Metabolic Panel: Recent Labs  Lab 01/18/20 2300 01/20/20 0706 01/21/20 0627 01/22/20 0649 01/23/20 0638  NA 139 139 139 141 139  K 2.9* 3.4* 4.0 3.8 3.7  CL 102 106 104 106 104  CO2 26 24 25 23 25   GLUCOSE 135* 164* 163* 172* 184*  BUN 24* 22 31* 31* 35*  CREATININE 1.38* 0.93 0.95 0.94  1.05  CALCIUM 8.3* 8.2* 8.7* 8.6* 8.2*  MG  --  1.9 2.2 2.1 2.1  PHOS  --  2.0* 2.6 1.8* 2.5    GFR: Estimated Creatinine Clearance: 61.1 mL/min (by C-G formula based on SCr of 1.05 mg/dL).  Liver Function Tests: Recent Labs  Lab 01/18/20 2300 01/20/20 0706 01/21/20 0627 01/22/20 0649 01/23/20 0638  AST 22 19 20  60* 79*  ALT 16 14 16  43 94*  ALKPHOS 53 46 48 48 46  BILITOT 1.3* 0.9 0.6 0.9 1.0  PROT 6.7 6.0* 6.4* 6.2* 5.9*  ALBUMIN 3.4* 2.9* 3.1* 3.0* 2.9*    CBG: No results for input(s): GLUCAP in the last 168 hours.  Recent Results (from the past 240 hour(s))  SARS Coronavirus 2 by RT PCR (hospital order, performed in [facility] hospital lab) Nasopharyngeal Nasopharyngeal Swab     Status: Abnormal   Collection Time: 01/19/20  1:46 AM   Specimen: Nasopharyngeal Swab  Result Value Ref Range Status   SARS Coronavirus 2 POSITIVE (A) NEGATIVE Final    Comment: RESULT CALLED TO, READ BACK BY AND VERIFIED WITH: T WALKER,RN@0327  01/19/20 MKELLY (NOTE) SARS-CoV-2 target nucleic acids are DETECTED  SARS-CoV-2 RNA is generally detectable in upper respiratory specimens  during the acute phase of infection.  Positive results are indicative  of the presence of the identified virus, but do not rule out bacterial infection or co-infection with other pathogens not detected by the test.  Clinical correlation with patient history and  other diagnostic information is necessary to determine patient infection status.  The expected result is negative.  Fact Sheet for Patients:   StrictlyIdeas.no   Fact Sheet for Healthcare Providers:   BankingDealers.co.za    This test is not yet approved or cleared by the [place] FDA and  has been authorized for detection and/or diagnosis of SARS-CoV-2 by FDA under an Emergency Use Authorization (EUA).  This EUA will remain in effect (meaning this test  can be used) for the duration of  the  COVID-19 declaration under Section 564(b)(1) of the Act, 21 U.S.C. section 360-bbb-3(b)(1), unless the authorization is terminated or revoked sooner.  Performed at [facility], [address], [place], [place] [address]      Radiology Studies: No results found.  Scheduled Meds: . amLODipine  10 mg Oral Daily  . vitamin C  500 mg Oral Daily  . aspirin EC  81 mg Oral Daily  . enoxaparin (LOVENOX) injection  40 mg Subcutaneous Q12H  . lisinopril  40 mg Oral BID  . methylPREDNISolone (SOLU-MEDROL) injection  0.5 mg/kg Intravenous Q12H  . zinc sulfate  220 mg Oral Daily   Continuous Infusions:   LOS: 4  days   Time spent: 30 mins  [place] Nabor Wynetta Emery, MD How to contact the [facility] Attending or Consulting provider [place] or covering provider during after hours [place], for this patient?  1. Check the care team in [facility] and look for a) attending/consulting TRH provider listed and b) the [facility] team listed 2. Log into [email or web address] and use [place]'s universal password to access. If you do not have the password, please contact the hospital operator. 3. Locate the [facility] provider you are looking for under [facility] and page to a number that you can be directly reached. 4. If you still have difficulty reaching the provider, please page the [facility] (Director on Call) for the Hospitalists listed on amion for assistance.  01/23/2020, 2:19 PM

## 2020-01-24 DIAGNOSIS — M6259 Muscle wasting and atrophy, not elsewhere classified, multiple sites: Secondary | ICD-10-CM | POA: Diagnosis not present

## 2020-01-24 DIAGNOSIS — M6281 Muscle weakness (generalized): Secondary | ICD-10-CM | POA: Diagnosis not present

## 2020-01-24 DIAGNOSIS — E782 Mixed hyperlipidemia: Secondary | ICD-10-CM | POA: Diagnosis not present

## 2020-01-24 DIAGNOSIS — I25810 Atherosclerosis of coronary artery bypass graft(s) without angina pectoris: Secondary | ICD-10-CM | POA: Diagnosis not present

## 2020-01-24 DIAGNOSIS — Z7401 Bed confinement status: Secondary | ICD-10-CM | POA: Diagnosis not present

## 2020-01-24 DIAGNOSIS — J1282 Pneumonia due to coronavirus disease 2019: Secondary | ICD-10-CM | POA: Diagnosis not present

## 2020-01-24 DIAGNOSIS — I10 Essential (primary) hypertension: Secondary | ICD-10-CM | POA: Diagnosis not present

## 2020-01-24 DIAGNOSIS — R6889 Other general symptoms and signs: Secondary | ICD-10-CM | POA: Diagnosis not present

## 2020-01-24 DIAGNOSIS — R1312 Dysphagia, oropharyngeal phase: Secondary | ICD-10-CM | POA: Diagnosis not present

## 2020-01-24 DIAGNOSIS — R7989 Other specified abnormal findings of blood chemistry: Secondary | ICD-10-CM | POA: Diagnosis not present

## 2020-01-24 DIAGNOSIS — R2681 Unsteadiness on feet: Secondary | ICD-10-CM | POA: Diagnosis not present

## 2020-01-24 DIAGNOSIS — Z743 Need for continuous supervision: Secondary | ICD-10-CM | POA: Diagnosis not present

## 2020-01-24 DIAGNOSIS — G9340 Encephalopathy, unspecified: Secondary | ICD-10-CM | POA: Diagnosis not present

## 2020-01-24 DIAGNOSIS — U071 COVID-19: Secondary | ICD-10-CM | POA: Diagnosis not present

## 2020-01-24 DIAGNOSIS — R2689 Other abnormalities of gait and mobility: Secondary | ICD-10-CM | POA: Diagnosis not present

## 2020-01-24 NOTE — TOC Transition Note (Signed)
Transition of Care [facility]) - CM/SW Discharge Note   Patient Details  Name: Henry Allen MRN: [id number] Date of Birth: 10-27-1940  [facility] CM/SW Contact:  Ihor Gully, LCSW Phone Number: 01/24/2020, 12:43 PM   Clinical Narrative:    Discharge clinicals sent to facility. RN called report and made family aware.  EMS transport arranged.   Final next level of care: Skilled Nursing Facility Barriers to Discharge: No Barriers Identified   Patient Goals and CMS Choice        Discharge Placement              Patient chooses bed at: [facility] Patient to be transferred to facility by: [facility] Name of family member notified: RN notified family. Patient and family notified of of transfer: 01/24/20  Discharge Plan and Services                                     Social Determinants of Health (SDOH) Interventions     Readmission Risk Interventions No flowsheet data found.

## 2020-01-24 NOTE — Progress Notes (Signed)
Phone call in from patients daughter, update given.  Elodia Florence RN 01/24/2020@1100 

## 2020-01-24 NOTE — Progress Notes (Signed)
Called report to [facility] @ [phone]. Report given to nurse Quillian Quince. Discharge packet given to [facility] for transport.  Elodia Florence RN 01/24/2020 @1400 

## 2020-01-28 DIAGNOSIS — G9340 Encephalopathy, unspecified: Secondary | ICD-10-CM | POA: Diagnosis not present

## 2020-01-28 DIAGNOSIS — J1282 Pneumonia due to coronavirus disease 2019: Secondary | ICD-10-CM | POA: Diagnosis not present

## 2020-01-28 DIAGNOSIS — I25810 Atherosclerosis of coronary artery bypass graft(s) without angina pectoris: Secondary | ICD-10-CM | POA: Diagnosis not present

## 2020-01-28 DIAGNOSIS — U071 COVID-19: Secondary | ICD-10-CM | POA: Diagnosis not present

## 2020-01-29 DIAGNOSIS — R7989 Other specified abnormal findings of blood chemistry: Secondary | ICD-10-CM | POA: Diagnosis not present

## 2020-02-03 DIAGNOSIS — M6281 Muscle weakness (generalized): Secondary | ICD-10-CM | POA: Diagnosis not present

## 2020-02-03 DIAGNOSIS — I10 Essential (primary) hypertension: Secondary | ICD-10-CM | POA: Diagnosis not present

## 2020-02-03 DIAGNOSIS — E782 Mixed hyperlipidemia: Secondary | ICD-10-CM | POA: Diagnosis not present

## 2020-02-03 DIAGNOSIS — R2681 Unsteadiness on feet: Secondary | ICD-10-CM | POA: Diagnosis not present

## 2020-02-03 DIAGNOSIS — U071 COVID-19: Secondary | ICD-10-CM | POA: Diagnosis not present

## 2020-02-05 DIAGNOSIS — E782 Mixed hyperlipidemia: Secondary | ICD-10-CM | POA: Diagnosis not present

## 2020-02-05 DIAGNOSIS — M6281 Muscle weakness (generalized): Secondary | ICD-10-CM | POA: Diagnosis not present

## 2020-02-05 DIAGNOSIS — I10 Essential (primary) hypertension: Secondary | ICD-10-CM | POA: Diagnosis not present

## 2020-02-05 DIAGNOSIS — R2681 Unsteadiness on feet: Secondary | ICD-10-CM | POA: Diagnosis not present

## 2020-02-05 DIAGNOSIS — U071 COVID-19: Secondary | ICD-10-CM | POA: Diagnosis not present

## 2020-02-06 DIAGNOSIS — J1282 Pneumonia due to coronavirus disease 2019: Secondary | ICD-10-CM | POA: Diagnosis not present

## 2020-02-06 DIAGNOSIS — U071 COVID-19: Secondary | ICD-10-CM | POA: Diagnosis not present

## 2020-02-06 DIAGNOSIS — G9340 Encephalopathy, unspecified: Secondary | ICD-10-CM | POA: Diagnosis not present

## 2020-02-07 DIAGNOSIS — M6281 Muscle weakness (generalized): Secondary | ICD-10-CM | POA: Diagnosis not present

## 2020-02-07 DIAGNOSIS — U071 COVID-19: Secondary | ICD-10-CM | POA: Diagnosis not present

## 2020-02-07 DIAGNOSIS — R2681 Unsteadiness on feet: Secondary | ICD-10-CM | POA: Diagnosis not present

## 2020-02-07 DIAGNOSIS — I10 Essential (primary) hypertension: Secondary | ICD-10-CM | POA: Diagnosis not present

## 2020-02-07 DIAGNOSIS — E782 Mixed hyperlipidemia: Secondary | ICD-10-CM | POA: Diagnosis not present

## 2020-02-08 DIAGNOSIS — M1991 Primary osteoarthritis, unspecified site: Secondary | ICD-10-CM | POA: Diagnosis not present

## 2020-02-08 DIAGNOSIS — I25810 Atherosclerosis of coronary artery bypass graft(s) without angina pectoris: Secondary | ICD-10-CM | POA: Diagnosis not present

## 2020-02-08 DIAGNOSIS — M6281 Muscle weakness (generalized): Secondary | ICD-10-CM | POA: Diagnosis not present

## 2020-02-08 DIAGNOSIS — R1312 Dysphagia, oropharyngeal phase: Secondary | ICD-10-CM | POA: Diagnosis not present

## 2020-02-08 DIAGNOSIS — R2689 Other abnormalities of gait and mobility: Secondary | ICD-10-CM | POA: Diagnosis not present

## 2020-02-08 DIAGNOSIS — I2510 Atherosclerotic heart disease of native coronary artery without angina pectoris: Secondary | ICD-10-CM | POA: Diagnosis not present

## 2020-02-08 DIAGNOSIS — I10 Essential (primary) hypertension: Secondary | ICD-10-CM | POA: Diagnosis not present

## 2020-02-08 DIAGNOSIS — E782 Mixed hyperlipidemia: Secondary | ICD-10-CM | POA: Diagnosis not present

## 2020-02-08 DIAGNOSIS — G9340 Encephalopathy, unspecified: Secondary | ICD-10-CM | POA: Diagnosis not present

## 2020-02-08 DIAGNOSIS — M6259 Muscle wasting and atrophy, not elsewhere classified, multiple sites: Secondary | ICD-10-CM | POA: Diagnosis not present

## 2020-02-08 DIAGNOSIS — U071 COVID-19: Secondary | ICD-10-CM | POA: Diagnosis not present

## 2020-02-08 DIAGNOSIS — R2681 Unsteadiness on feet: Secondary | ICD-10-CM | POA: Diagnosis not present

## 2020-02-08 DIAGNOSIS — I150 Renovascular hypertension: Secondary | ICD-10-CM | POA: Diagnosis not present

## 2020-02-10 DIAGNOSIS — U071 COVID-19: Secondary | ICD-10-CM | POA: Diagnosis not present

## 2020-02-10 DIAGNOSIS — R1312 Dysphagia, oropharyngeal phase: Secondary | ICD-10-CM | POA: Diagnosis not present

## 2020-02-10 DIAGNOSIS — R2689 Other abnormalities of gait and mobility: Secondary | ICD-10-CM | POA: Diagnosis not present

## 2020-02-10 DIAGNOSIS — R2681 Unsteadiness on feet: Secondary | ICD-10-CM | POA: Diagnosis not present

## 2020-02-10 DIAGNOSIS — M6281 Muscle weakness (generalized): Secondary | ICD-10-CM | POA: Diagnosis not present

## 2020-02-10 DIAGNOSIS — E782 Mixed hyperlipidemia: Secondary | ICD-10-CM | POA: Diagnosis not present

## 2020-02-10 DIAGNOSIS — I10 Essential (primary) hypertension: Secondary | ICD-10-CM | POA: Diagnosis not present

## 2020-02-10 DIAGNOSIS — M6259 Muscle wasting and atrophy, not elsewhere classified, multiple sites: Secondary | ICD-10-CM | POA: Diagnosis not present

## 2020-02-11 DIAGNOSIS — R1312 Dysphagia, oropharyngeal phase: Secondary | ICD-10-CM | POA: Diagnosis not present

## 2020-02-11 DIAGNOSIS — M6281 Muscle weakness (generalized): Secondary | ICD-10-CM | POA: Diagnosis not present

## 2020-02-11 DIAGNOSIS — R2681 Unsteadiness on feet: Secondary | ICD-10-CM | POA: Diagnosis not present

## 2020-02-11 DIAGNOSIS — R2689 Other abnormalities of gait and mobility: Secondary | ICD-10-CM | POA: Diagnosis not present

## 2020-02-11 DIAGNOSIS — M6259 Muscle wasting and atrophy, not elsewhere classified, multiple sites: Secondary | ICD-10-CM | POA: Diagnosis not present

## 2020-02-12 DIAGNOSIS — M6259 Muscle wasting and atrophy, not elsewhere classified, multiple sites: Secondary | ICD-10-CM | POA: Diagnosis not present

## 2020-02-12 DIAGNOSIS — M6281 Muscle weakness (generalized): Secondary | ICD-10-CM | POA: Diagnosis not present

## 2020-02-12 DIAGNOSIS — R1312 Dysphagia, oropharyngeal phase: Secondary | ICD-10-CM | POA: Diagnosis not present

## 2020-02-12 DIAGNOSIS — R2689 Other abnormalities of gait and mobility: Secondary | ICD-10-CM | POA: Diagnosis not present

## 2020-02-12 DIAGNOSIS — R2681 Unsteadiness on feet: Secondary | ICD-10-CM | POA: Diagnosis not present

## 2020-02-13 DIAGNOSIS — U071 COVID-19: Secondary | ICD-10-CM | POA: Diagnosis not present

## 2020-02-13 DIAGNOSIS — I25810 Atherosclerosis of coronary artery bypass graft(s) without angina pectoris: Secondary | ICD-10-CM | POA: Diagnosis not present

## 2020-02-13 DIAGNOSIS — I150 Renovascular hypertension: Secondary | ICD-10-CM | POA: Diagnosis not present

## 2020-02-13 DIAGNOSIS — G9340 Encephalopathy, unspecified: Secondary | ICD-10-CM | POA: Diagnosis not present

## 2020-02-14 DIAGNOSIS — M6259 Muscle wasting and atrophy, not elsewhere classified, multiple sites: Secondary | ICD-10-CM | POA: Diagnosis not present

## 2020-02-14 DIAGNOSIS — R2681 Unsteadiness on feet: Secondary | ICD-10-CM | POA: Diagnosis not present

## 2020-02-14 DIAGNOSIS — R2689 Other abnormalities of gait and mobility: Secondary | ICD-10-CM | POA: Diagnosis not present

## 2020-02-14 DIAGNOSIS — R1312 Dysphagia, oropharyngeal phase: Secondary | ICD-10-CM | POA: Diagnosis not present

## 2020-02-14 DIAGNOSIS — M6281 Muscle weakness (generalized): Secondary | ICD-10-CM | POA: Diagnosis not present

## 2020-02-15 DIAGNOSIS — R2689 Other abnormalities of gait and mobility: Secondary | ICD-10-CM | POA: Diagnosis not present

## 2020-02-15 DIAGNOSIS — M6259 Muscle wasting and atrophy, not elsewhere classified, multiple sites: Secondary | ICD-10-CM | POA: Diagnosis not present

## 2020-02-15 DIAGNOSIS — R2681 Unsteadiness on feet: Secondary | ICD-10-CM | POA: Diagnosis not present

## 2020-02-15 DIAGNOSIS — M6281 Muscle weakness (generalized): Secondary | ICD-10-CM | POA: Diagnosis not present

## 2020-02-15 DIAGNOSIS — R1312 Dysphagia, oropharyngeal phase: Secondary | ICD-10-CM | POA: Diagnosis not present

## 2020-02-17 DIAGNOSIS — R1312 Dysphagia, oropharyngeal phase: Secondary | ICD-10-CM | POA: Diagnosis not present

## 2020-02-17 DIAGNOSIS — R2689 Other abnormalities of gait and mobility: Secondary | ICD-10-CM | POA: Diagnosis not present

## 2020-02-17 DIAGNOSIS — M6281 Muscle weakness (generalized): Secondary | ICD-10-CM | POA: Diagnosis not present

## 2020-02-17 DIAGNOSIS — M6259 Muscle wasting and atrophy, not elsewhere classified, multiple sites: Secondary | ICD-10-CM | POA: Diagnosis not present

## 2020-02-17 DIAGNOSIS — R2681 Unsteadiness on feet: Secondary | ICD-10-CM | POA: Diagnosis not present

## 2020-02-18 DIAGNOSIS — R2689 Other abnormalities of gait and mobility: Secondary | ICD-10-CM | POA: Diagnosis not present

## 2020-02-18 DIAGNOSIS — M6259 Muscle wasting and atrophy, not elsewhere classified, multiple sites: Secondary | ICD-10-CM | POA: Diagnosis not present

## 2020-02-18 DIAGNOSIS — R2681 Unsteadiness on feet: Secondary | ICD-10-CM | POA: Diagnosis not present

## 2020-02-18 DIAGNOSIS — M6281 Muscle weakness (generalized): Secondary | ICD-10-CM | POA: Diagnosis not present

## 2020-02-18 DIAGNOSIS — R1312 Dysphagia, oropharyngeal phase: Secondary | ICD-10-CM | POA: Diagnosis not present

## 2020-02-20 DIAGNOSIS — M6281 Muscle weakness (generalized): Secondary | ICD-10-CM | POA: Diagnosis not present

## 2020-02-20 DIAGNOSIS — I2510 Atherosclerotic heart disease of native coronary artery without angina pectoris: Secondary | ICD-10-CM | POA: Diagnosis not present

## 2020-02-20 DIAGNOSIS — R1312 Dysphagia, oropharyngeal phase: Secondary | ICD-10-CM | POA: Diagnosis not present

## 2020-02-20 DIAGNOSIS — M1991 Primary osteoarthritis, unspecified site: Secondary | ICD-10-CM | POA: Diagnosis not present

## 2020-02-20 DIAGNOSIS — M6259 Muscle wasting and atrophy, not elsewhere classified, multiple sites: Secondary | ICD-10-CM | POA: Diagnosis not present

## 2020-02-20 DIAGNOSIS — I10 Essential (primary) hypertension: Secondary | ICD-10-CM | POA: Diagnosis not present

## 2020-02-20 DIAGNOSIS — R2689 Other abnormalities of gait and mobility: Secondary | ICD-10-CM | POA: Diagnosis not present

## 2020-02-20 DIAGNOSIS — R2681 Unsteadiness on feet: Secondary | ICD-10-CM | POA: Diagnosis not present

## 2020-02-21 DIAGNOSIS — R2689 Other abnormalities of gait and mobility: Secondary | ICD-10-CM | POA: Diagnosis not present

## 2020-02-21 DIAGNOSIS — M6259 Muscle wasting and atrophy, not elsewhere classified, multiple sites: Secondary | ICD-10-CM | POA: Diagnosis not present

## 2020-02-21 DIAGNOSIS — R2681 Unsteadiness on feet: Secondary | ICD-10-CM | POA: Diagnosis not present

## 2020-02-21 DIAGNOSIS — R1312 Dysphagia, oropharyngeal phase: Secondary | ICD-10-CM | POA: Diagnosis not present

## 2020-02-21 DIAGNOSIS — M6281 Muscle weakness (generalized): Secondary | ICD-10-CM | POA: Diagnosis not present

## 2020-02-22 DIAGNOSIS — R1312 Dysphagia, oropharyngeal phase: Secondary | ICD-10-CM | POA: Diagnosis not present

## 2020-02-22 DIAGNOSIS — M6259 Muscle wasting and atrophy, not elsewhere classified, multiple sites: Secondary | ICD-10-CM | POA: Diagnosis not present

## 2020-02-22 DIAGNOSIS — M6281 Muscle weakness (generalized): Secondary | ICD-10-CM | POA: Diagnosis not present

## 2020-02-22 DIAGNOSIS — R2689 Other abnormalities of gait and mobility: Secondary | ICD-10-CM | POA: Diagnosis not present

## 2020-02-22 DIAGNOSIS — R2681 Unsteadiness on feet: Secondary | ICD-10-CM | POA: Diagnosis not present

## 2020-02-24 DIAGNOSIS — R2689 Other abnormalities of gait and mobility: Secondary | ICD-10-CM | POA: Diagnosis not present

## 2020-02-24 DIAGNOSIS — R1312 Dysphagia, oropharyngeal phase: Secondary | ICD-10-CM | POA: Diagnosis not present

## 2020-02-24 DIAGNOSIS — M6281 Muscle weakness (generalized): Secondary | ICD-10-CM | POA: Diagnosis not present

## 2020-02-24 DIAGNOSIS — M6259 Muscle wasting and atrophy, not elsewhere classified, multiple sites: Secondary | ICD-10-CM | POA: Diagnosis not present

## 2020-02-24 DIAGNOSIS — R2681 Unsteadiness on feet: Secondary | ICD-10-CM | POA: Diagnosis not present

## 2020-02-25 DIAGNOSIS — M6259 Muscle wasting and atrophy, not elsewhere classified, multiple sites: Secondary | ICD-10-CM | POA: Diagnosis not present

## 2020-02-25 DIAGNOSIS — R1312 Dysphagia, oropharyngeal phase: Secondary | ICD-10-CM | POA: Diagnosis not present

## 2020-02-25 DIAGNOSIS — M6281 Muscle weakness (generalized): Secondary | ICD-10-CM | POA: Diagnosis not present

## 2020-02-25 DIAGNOSIS — R2681 Unsteadiness on feet: Secondary | ICD-10-CM | POA: Diagnosis not present

## 2020-02-25 DIAGNOSIS — R2689 Other abnormalities of gait and mobility: Secondary | ICD-10-CM | POA: Diagnosis not present

## 2020-02-26 DIAGNOSIS — M6281 Muscle weakness (generalized): Secondary | ICD-10-CM | POA: Diagnosis not present

## 2020-02-26 DIAGNOSIS — R2689 Other abnormalities of gait and mobility: Secondary | ICD-10-CM | POA: Diagnosis not present

## 2020-02-26 DIAGNOSIS — R1312 Dysphagia, oropharyngeal phase: Secondary | ICD-10-CM | POA: Diagnosis not present

## 2020-02-26 DIAGNOSIS — M6259 Muscle wasting and atrophy, not elsewhere classified, multiple sites: Secondary | ICD-10-CM | POA: Diagnosis not present

## 2020-02-26 DIAGNOSIS — R2681 Unsteadiness on feet: Secondary | ICD-10-CM | POA: Diagnosis not present

## 2020-02-27 DIAGNOSIS — R1312 Dysphagia, oropharyngeal phase: Secondary | ICD-10-CM | POA: Diagnosis not present

## 2020-02-27 DIAGNOSIS — R2681 Unsteadiness on feet: Secondary | ICD-10-CM | POA: Diagnosis not present

## 2020-02-27 DIAGNOSIS — R2689 Other abnormalities of gait and mobility: Secondary | ICD-10-CM | POA: Diagnosis not present

## 2020-02-27 DIAGNOSIS — M6281 Muscle weakness (generalized): Secondary | ICD-10-CM | POA: Diagnosis not present

## 2020-02-27 DIAGNOSIS — M6259 Muscle wasting and atrophy, not elsewhere classified, multiple sites: Secondary | ICD-10-CM | POA: Diagnosis not present

## 2020-02-28 DIAGNOSIS — M6281 Muscle weakness (generalized): Secondary | ICD-10-CM | POA: Diagnosis not present

## 2020-02-28 DIAGNOSIS — M6259 Muscle wasting and atrophy, not elsewhere classified, multiple sites: Secondary | ICD-10-CM | POA: Diagnosis not present

## 2020-02-28 DIAGNOSIS — R262 Difficulty in walking, not elsewhere classified: Secondary | ICD-10-CM | POA: Diagnosis not present

## 2020-02-28 DIAGNOSIS — L603 Nail dystrophy: Secondary | ICD-10-CM | POA: Diagnosis not present

## 2020-02-28 DIAGNOSIS — B351 Tinea unguium: Secondary | ICD-10-CM | POA: Diagnosis not present

## 2020-02-28 DIAGNOSIS — R1312 Dysphagia, oropharyngeal phase: Secondary | ICD-10-CM | POA: Diagnosis not present

## 2020-02-28 DIAGNOSIS — I739 Peripheral vascular disease, unspecified: Secondary | ICD-10-CM | POA: Diagnosis not present

## 2020-02-28 DIAGNOSIS — M2041 Other hammer toe(s) (acquired), right foot: Secondary | ICD-10-CM | POA: Diagnosis not present

## 2020-02-28 DIAGNOSIS — R2681 Unsteadiness on feet: Secondary | ICD-10-CM | POA: Diagnosis not present

## 2020-02-28 DIAGNOSIS — R2689 Other abnormalities of gait and mobility: Secondary | ICD-10-CM | POA: Diagnosis not present

## 2020-02-28 DIAGNOSIS — M2042 Other hammer toe(s) (acquired), left foot: Secondary | ICD-10-CM | POA: Diagnosis not present

## 2020-03-02 DIAGNOSIS — R2689 Other abnormalities of gait and mobility: Secondary | ICD-10-CM | POA: Diagnosis not present

## 2020-03-02 DIAGNOSIS — R1312 Dysphagia, oropharyngeal phase: Secondary | ICD-10-CM | POA: Diagnosis not present

## 2020-03-02 DIAGNOSIS — M6259 Muscle wasting and atrophy, not elsewhere classified, multiple sites: Secondary | ICD-10-CM | POA: Diagnosis not present

## 2020-03-02 DIAGNOSIS — R2681 Unsteadiness on feet: Secondary | ICD-10-CM | POA: Diagnosis not present

## 2020-03-02 DIAGNOSIS — M6281 Muscle weakness (generalized): Secondary | ICD-10-CM | POA: Diagnosis not present

## 2020-03-03 DIAGNOSIS — R2681 Unsteadiness on feet: Secondary | ICD-10-CM | POA: Diagnosis not present

## 2020-03-03 DIAGNOSIS — R1312 Dysphagia, oropharyngeal phase: Secondary | ICD-10-CM | POA: Diagnosis not present

## 2020-03-03 DIAGNOSIS — M6281 Muscle weakness (generalized): Secondary | ICD-10-CM | POA: Diagnosis not present

## 2020-03-03 DIAGNOSIS — R2689 Other abnormalities of gait and mobility: Secondary | ICD-10-CM | POA: Diagnosis not present

## 2020-03-03 DIAGNOSIS — M6259 Muscle wasting and atrophy, not elsewhere classified, multiple sites: Secondary | ICD-10-CM | POA: Diagnosis not present

## 2020-03-04 DIAGNOSIS — R1312 Dysphagia, oropharyngeal phase: Secondary | ICD-10-CM | POA: Diagnosis not present

## 2020-03-04 DIAGNOSIS — R2689 Other abnormalities of gait and mobility: Secondary | ICD-10-CM | POA: Diagnosis not present

## 2020-03-04 DIAGNOSIS — M6259 Muscle wasting and atrophy, not elsewhere classified, multiple sites: Secondary | ICD-10-CM | POA: Diagnosis not present

## 2020-03-04 DIAGNOSIS — R2681 Unsteadiness on feet: Secondary | ICD-10-CM | POA: Diagnosis not present

## 2020-03-04 DIAGNOSIS — M6281 Muscle weakness (generalized): Secondary | ICD-10-CM | POA: Diagnosis not present

## 2020-03-05 DIAGNOSIS — R1312 Dysphagia, oropharyngeal phase: Secondary | ICD-10-CM | POA: Diagnosis not present

## 2020-03-05 DIAGNOSIS — R2689 Other abnormalities of gait and mobility: Secondary | ICD-10-CM | POA: Diagnosis not present

## 2020-03-05 DIAGNOSIS — M6281 Muscle weakness (generalized): Secondary | ICD-10-CM | POA: Diagnosis not present

## 2020-03-05 DIAGNOSIS — M6259 Muscle wasting and atrophy, not elsewhere classified, multiple sites: Secondary | ICD-10-CM | POA: Diagnosis not present

## 2020-03-05 DIAGNOSIS — R2681 Unsteadiness on feet: Secondary | ICD-10-CM | POA: Diagnosis not present

## 2020-03-06 DIAGNOSIS — R1312 Dysphagia, oropharyngeal phase: Secondary | ICD-10-CM | POA: Diagnosis not present

## 2020-03-06 DIAGNOSIS — M6281 Muscle weakness (generalized): Secondary | ICD-10-CM | POA: Diagnosis not present

## 2020-03-06 DIAGNOSIS — M6259 Muscle wasting and atrophy, not elsewhere classified, multiple sites: Secondary | ICD-10-CM | POA: Diagnosis not present

## 2020-03-06 DIAGNOSIS — R2689 Other abnormalities of gait and mobility: Secondary | ICD-10-CM | POA: Diagnosis not present

## 2020-03-06 DIAGNOSIS — R2681 Unsteadiness on feet: Secondary | ICD-10-CM | POA: Diagnosis not present

## 2020-03-09 DIAGNOSIS — M6281 Muscle weakness (generalized): Secondary | ICD-10-CM | POA: Diagnosis not present

## 2020-03-09 DIAGNOSIS — R2689 Other abnormalities of gait and mobility: Secondary | ICD-10-CM | POA: Diagnosis not present

## 2020-03-09 DIAGNOSIS — R1312 Dysphagia, oropharyngeal phase: Secondary | ICD-10-CM | POA: Diagnosis not present

## 2020-03-09 DIAGNOSIS — R2681 Unsteadiness on feet: Secondary | ICD-10-CM | POA: Diagnosis not present

## 2020-03-09 DIAGNOSIS — M6259 Muscle wasting and atrophy, not elsewhere classified, multiple sites: Secondary | ICD-10-CM | POA: Diagnosis not present

## 2020-03-10 DIAGNOSIS — R2681 Unsteadiness on feet: Secondary | ICD-10-CM | POA: Diagnosis not present

## 2020-03-10 DIAGNOSIS — M6259 Muscle wasting and atrophy, not elsewhere classified, multiple sites: Secondary | ICD-10-CM | POA: Diagnosis not present

## 2020-03-10 DIAGNOSIS — M6281 Muscle weakness (generalized): Secondary | ICD-10-CM | POA: Diagnosis not present

## 2020-03-10 DIAGNOSIS — R2689 Other abnormalities of gait and mobility: Secondary | ICD-10-CM | POA: Diagnosis not present

## 2020-03-10 DIAGNOSIS — R1312 Dysphagia, oropharyngeal phase: Secondary | ICD-10-CM | POA: Diagnosis not present

## 2020-03-11 DIAGNOSIS — R2681 Unsteadiness on feet: Secondary | ICD-10-CM | POA: Diagnosis not present

## 2020-03-11 DIAGNOSIS — M6281 Muscle weakness (generalized): Secondary | ICD-10-CM | POA: Diagnosis not present

## 2020-03-11 DIAGNOSIS — M6259 Muscle wasting and atrophy, not elsewhere classified, multiple sites: Secondary | ICD-10-CM | POA: Diagnosis not present

## 2020-03-11 DIAGNOSIS — R1312 Dysphagia, oropharyngeal phase: Secondary | ICD-10-CM | POA: Diagnosis not present

## 2020-03-11 DIAGNOSIS — R2689 Other abnormalities of gait and mobility: Secondary | ICD-10-CM | POA: Diagnosis not present

## 2020-03-12 DIAGNOSIS — M6281 Muscle weakness (generalized): Secondary | ICD-10-CM | POA: Diagnosis not present

## 2020-03-12 DIAGNOSIS — R2681 Unsteadiness on feet: Secondary | ICD-10-CM | POA: Diagnosis not present

## 2020-03-12 DIAGNOSIS — R2689 Other abnormalities of gait and mobility: Secondary | ICD-10-CM | POA: Diagnosis not present

## 2020-03-12 DIAGNOSIS — R1312 Dysphagia, oropharyngeal phase: Secondary | ICD-10-CM | POA: Diagnosis not present

## 2020-03-12 DIAGNOSIS — M6259 Muscle wasting and atrophy, not elsewhere classified, multiple sites: Secondary | ICD-10-CM | POA: Diagnosis not present

## 2020-03-17 DIAGNOSIS — I6930 Unspecified sequelae of cerebral infarction: Secondary | ICD-10-CM | POA: Diagnosis not present

## 2020-03-17 DIAGNOSIS — I2510 Atherosclerotic heart disease of native coronary artery without angina pectoris: Secondary | ICD-10-CM | POA: Diagnosis not present

## 2020-04-14 DIAGNOSIS — I69318 Other symptoms and signs involving cognitive functions following cerebral infarction: Secondary | ICD-10-CM | POA: Diagnosis not present

## 2020-04-14 DIAGNOSIS — I129 Hypertensive chronic kidney disease with stage 1 through stage 4 chronic kidney disease, or unspecified chronic kidney disease: Secondary | ICD-10-CM | POA: Diagnosis not present

## 2020-04-14 DIAGNOSIS — R600 Localized edema: Secondary | ICD-10-CM | POA: Diagnosis not present

## 2020-04-14 DIAGNOSIS — I2510 Atherosclerotic heart disease of native coronary artery without angina pectoris: Secondary | ICD-10-CM | POA: Diagnosis not present

## 2020-04-15 DIAGNOSIS — Z79899 Other long term (current) drug therapy: Secondary | ICD-10-CM | POA: Diagnosis not present

## 2020-04-15 DIAGNOSIS — D649 Anemia, unspecified: Secondary | ICD-10-CM | POA: Diagnosis not present

## 2020-05-06 NOTE — Progress Notes (Deleted)
Subjective:  No diagnosis found.     11/15/19: Henry Allen returns today in f/u for his history of an elevated PSA that is up to 27.2 from 24.8 6 months ago.  His prior high was 30.  He is voiding well with an IPSS of 6.   He has had no hematuria or dysuria.  He has had no weight loss or bone pain.   He has routinely declined biopsy.       ROS:  ROS:  A complete review of systems was performed.  All systems are negative except for pertinent findings as noted.   ROS  Allergies  Allergen Reactions  . Morphine Other (See Comments)    hallucinate    Outpatient Encounter Medications as of 05/08/2020  Medication Sig Note  . albuterol (VENTOLIN HFA) 108 (90 Base) MCG/ACT inhaler Inhale 2 puffs into the lungs every 6 (six) hours as needed for wheezing or shortness of breath.   Marland Kitchen amLODipine (NORVASC) 10 MG tablet Take 10 mg by mouth daily. 01/19/2020: 12/02/2019 90 DS  . aspirin 81 MG tablet Take 81 mg by mouth daily.   . calcium-vitamin D (OSCAL WITH D) 500-200 MG-UNIT tablet Take 1 tablet by mouth.   . Flaxseed, Linseed, (FLAX SEED OIL) 1300 MG CAPS Take by mouth.   Marland Kitchen guaiFENesin-dextromethorphan (ROBITUSSIN DM) 100-10 MG/5ML syrup Take 10 mLs by mouth every 4 (four) hours as needed for cough.   Marland Kitchen lisinopril (PRINIVIL,ZESTRIL) 40 MG tablet Take 40 mg by mouth 2 (two) times daily. 01/19/2020: 11/16/2019 90 DS  . metoprolol tartrate (LOPRESSOR) 25 MG tablet Take 25 mg by mouth 2 (two) times daily. 01/19/2020: 11/18/2019 90 DS  . omeprazole (PRILOSEC) 20 MG capsule Take 1 capsule (20 mg total) by mouth daily.   Marland Kitchen zinc sulfate 220 (50 Zn) MG capsule Take 1 capsule (220 mg total) by mouth daily.    No facility-administered encounter medications on file as of 05/08/2020.    Past Medical History:  Diagnosis Date  . Abdominal aortic aneurysm ([place]) 05/06/2008   7cm Infrarenal abdominal aortic aneurysm and bilateral common iliac aneurysms using a 20x10 bifurcated graft  . CAD (coronary artery  disease) 2009   CABG   . Elevated PSA   . Hemorrhagic stroke ([place]) 1999  . Hyperlipemia   . Hypertension   . Renal artery stenosis ([facility])    he has minor atherosclerosis in the right renal artery but a 60-99% stenosis in the left renal artery.  . Stroke ([place])   . Vascular dementia ([place])   . Ventral hernia     Past Surgical History:  Procedure Laterality Date  . CARDIAC CATHETERIZATION     showed severe three-vessel coronary disease  . CORONARY ARTERY BYPASS GRAFT  11/02/2007   x4 by Dr Gilford Raid    Social History   Socioeconomic History  . Marital status: Married    Spouse name: Not on file  . Number of children: Not on file  . Years of education: Not on file  . Highest education level: Not on file  Occupational History  . Not on file  Tobacco Use  . Smoking status: Former Smoker    Packs/day: 1.00    Types: Cigarettes  . Smokeless tobacco: Never Used  Substance and Sexual Activity  . Alcohol use: Not on file  . Drug use: Not on file  . Sexual activity: Not on file  Other Topics Concern  . Not on file  Social History Narrative  . Not on file  Social Determinants of Health   Financial Resource Strain: Not on file  Food Insecurity: Not on file  Transportation Needs: Not on file  Physical Activity: Not on file  Stress: Not on file  Social Connections: Not on file  Intimate Partner Violence: Not on file    Family History  Problem Relation Age of Onset  . Heart attack Mother 33  . Heart attack Father 35  . Heart attack Brother        x2 bother at age 56       Objective: There were no vitals filed for this visit.   Physical Exam  Lab Results:  PSA reviewed.    Studies/Results: No results found.      Assessment & Plan: Elevated PSA.  His level is back up some.  He has routinely declined biopsy but has no worrisome symptoms.  BPH with BOO.  He has mild LUTS and doesn't need treatment.    No orders of the defined types were placed in  this encounter.    No orders of the defined types were placed in this encounter.     No follow-ups on file.   CC: Redmond School, MD      Irine Seal 05/06/2020

## 2020-05-08 ENCOUNTER — Ambulatory Visit: Payer: Medicare Other | Admitting: Urology

## 2020-06-18 DIAGNOSIS — L603 Nail dystrophy: Secondary | ICD-10-CM | POA: Diagnosis not present

## 2020-06-18 DIAGNOSIS — I150 Renovascular hypertension: Secondary | ICD-10-CM | POA: Diagnosis not present

## 2020-06-18 DIAGNOSIS — I739 Peripheral vascular disease, unspecified: Secondary | ICD-10-CM | POA: Diagnosis not present

## 2020-06-18 DIAGNOSIS — B351 Tinea unguium: Secondary | ICD-10-CM | POA: Diagnosis not present

## 2020-06-18 DIAGNOSIS — I2510 Atherosclerotic heart disease of native coronary artery without angina pectoris: Secondary | ICD-10-CM | POA: Diagnosis not present

## 2020-06-18 DIAGNOSIS — I6930 Unspecified sequelae of cerebral infarction: Secondary | ICD-10-CM | POA: Diagnosis not present

## 2020-06-19 ENCOUNTER — Other Ambulatory Visit: Payer: Self-pay

## 2020-06-19 ENCOUNTER — Observation Stay (HOSPITAL_COMMUNITY)
Admission: EM | Admit: 2020-06-19 | Discharge: 2020-06-20 | Disposition: A | Discharge status: Home / Self Care | Payer: Medicare Other | Attending: Internal Medicine | Admitting: Internal Medicine

## 2020-06-19 ENCOUNTER — Emergency Department (HOSPITAL_COMMUNITY): Payer: Medicare Other

## 2020-06-19 DIAGNOSIS — Z7409 Other reduced mobility: Secondary | ICD-10-CM | POA: Insufficient documentation

## 2020-06-19 DIAGNOSIS — Z951 Presence of aortocoronary bypass graft: Secondary | ICD-10-CM | POA: Insufficient documentation

## 2020-06-19 DIAGNOSIS — N183 Chronic kidney disease, stage 3 unspecified: Secondary | ICD-10-CM | POA: Diagnosis not present

## 2020-06-19 DIAGNOSIS — F0390 Unspecified dementia without behavioral disturbance: Secondary | ICD-10-CM | POA: Diagnosis not present

## 2020-06-19 DIAGNOSIS — Z79899 Other long term (current) drug therapy: Secondary | ICD-10-CM | POA: Diagnosis not present

## 2020-06-19 DIAGNOSIS — R001 Bradycardia, unspecified: Secondary | ICD-10-CM | POA: Diagnosis not present

## 2020-06-19 DIAGNOSIS — K8020 Calculus of gallbladder without cholecystitis without obstruction: Secondary | ICD-10-CM | POA: Diagnosis not present

## 2020-06-19 DIAGNOSIS — J9601 Acute respiratory failure with hypoxia: Secondary | ICD-10-CM | POA: Insufficient documentation

## 2020-06-19 DIAGNOSIS — C3402 Malignant neoplasm of left main bronchus: Secondary | ICD-10-CM | POA: Diagnosis not present

## 2020-06-19 DIAGNOSIS — Z7982 Long term (current) use of aspirin: Secondary | ICD-10-CM | POA: Insufficient documentation

## 2020-06-19 DIAGNOSIS — Z87891 Personal history of nicotine dependence: Secondary | ICD-10-CM | POA: Insufficient documentation

## 2020-06-19 DIAGNOSIS — C3490 Malignant neoplasm of unspecified part of unspecified bronchus or lung: Secondary | ICD-10-CM | POA: Diagnosis present

## 2020-06-19 DIAGNOSIS — Z20822 Contact with and (suspected) exposure to covid-19: Secondary | ICD-10-CM | POA: Insufficient documentation

## 2020-06-19 DIAGNOSIS — Z743 Need for continuous supervision: Secondary | ICD-10-CM | POA: Diagnosis not present

## 2020-06-19 DIAGNOSIS — D519 Vitamin B12 deficiency anemia, unspecified: Secondary | ICD-10-CM | POA: Diagnosis not present

## 2020-06-19 DIAGNOSIS — Z8616 Personal history of COVID-19: Secondary | ICD-10-CM | POA: Diagnosis not present

## 2020-06-19 DIAGNOSIS — I2510 Atherosclerotic heart disease of native coronary artery without angina pectoris: Secondary | ICD-10-CM | POA: Insufficient documentation

## 2020-06-19 DIAGNOSIS — I129 Hypertensive chronic kidney disease with stage 1 through stage 4 chronic kidney disease, or unspecified chronic kidney disease: Secondary | ICD-10-CM | POA: Diagnosis not present

## 2020-06-19 DIAGNOSIS — R58 Hemorrhage, not elsewhere classified: Secondary | ICD-10-CM | POA: Diagnosis not present

## 2020-06-19 DIAGNOSIS — I10 Essential (primary) hypertension: Secondary | ICD-10-CM | POA: Diagnosis not present

## 2020-06-19 DIAGNOSIS — R918 Other nonspecific abnormal finding of lung field: Secondary | ICD-10-CM

## 2020-06-19 DIAGNOSIS — R6889 Other general symptoms and signs: Secondary | ICD-10-CM | POA: Diagnosis not present

## 2020-06-19 DIAGNOSIS — J90 Pleural effusion, not elsewhere classified: Secondary | ICD-10-CM | POA: Diagnosis not present

## 2020-06-19 DIAGNOSIS — R042 Hemoptysis: Principal | ICD-10-CM | POA: Diagnosis present

## 2020-06-19 DIAGNOSIS — I499 Cardiac arrhythmia, unspecified: Secondary | ICD-10-CM | POA: Diagnosis not present

## 2020-06-19 DIAGNOSIS — E876 Hypokalemia: Secondary | ICD-10-CM | POA: Diagnosis not present

## 2020-06-19 LAB — CBC
HCT: 44.5 % (ref 39.0–52.0)
Hemoglobin: 13.9 g/dL (ref 13.0–17.0)
MCH: 28.6 pg (ref 26.0–34.0)
MCHC: 31.2 g/dL (ref 30.0–36.0)
MCV: 91.6 fL (ref 80.0–100.0)
Platelets: 238 10*3/uL (ref 150–400)
RBC: 4.86 MIL/uL (ref 4.22–5.81)
RDW: 13.9 % (ref 11.5–15.5)
WBC: 10.3 10*3/uL (ref 4.0–10.5)
nRBC: 0 % (ref 0.0–0.2)

## 2020-06-19 LAB — BASIC METABOLIC PANEL
Anion gap: 13 (ref 5–15)
BUN: 25 mg/dL — ABNORMAL HIGH (ref 8–23)
CO2: 29 mmol/L (ref 22–32)
Calcium: 10.1 mg/dL (ref 8.9–10.3)
Chloride: 99 mmol/L (ref 98–111)
Creatinine, Ser: 1.25 mg/dL — ABNORMAL HIGH (ref 0.61–1.24)
GFR, Estimated: 58 mL/min — ABNORMAL LOW (ref >60)
Glucose, Bld: 104 mg/dL — ABNORMAL HIGH (ref 70–99)
Potassium: 3.6 mmol/L (ref 3.5–5.1)
Sodium: 141 mmol/L (ref 135–145)

## 2020-06-19 LAB — PROTIME-INR
INR: 1 (ref 0.8–1.2)
Prothrombin Time: 12.8 seconds (ref 11.4–15.2)

## 2020-06-19 LAB — SARS CORONAVIRUS 2 BY RT PCR (HOSPITAL ORDER, PERFORMED IN ~~LOC~~ HOSPITAL LAB): SARS Coronavirus 2: NEGATIVE

## 2020-06-19 LAB — GLUCOSE, CAPILLARY: Glucose-Capillary: 117 mg/dL — ABNORMAL HIGH (ref 70–99)

## 2020-06-19 MED ORDER — HYDRALAZINE HCL 25 MG PO TABS
25.0000 mg | ORAL_TABLET | Freq: Four times a day (QID) | ORAL | Status: DC | PRN
  Administered 2020-06-19 – 2020-06-20 (×2): 25 mg via ORAL
  Filled 2020-06-19 (×3): qty 1

## 2020-06-19 MED ORDER — GUAIFENESIN-DM 100-10 MG/5ML PO SYRP: 10.0000 mL | ORAL_SOLUTION | ORAL | Status: DC | PRN

## 2020-06-19 MED ORDER — HYDROCHLOROTHIAZIDE 25 MG PO TABS
25.0000 mg | ORAL_TABLET | Freq: Every day | ORAL | Status: DC
  Administered 2020-06-19 – 2020-06-20 (×2): 25 mg via ORAL
  Filled 2020-06-19 (×2): qty 1

## 2020-06-19 MED ORDER — BENZONATATE 100 MG PO CAPS
200.0000 mg | ORAL_CAPSULE | Freq: Three times a day (TID) | ORAL | Status: DC
  Administered 2020-06-19 – 2020-06-20 (×4): 200 mg via ORAL
  Filled 2020-06-19 (×5): qty 2

## 2020-06-19 MED ORDER — ALBUTEROL SULFATE HFA 108 (90 BASE) MCG/ACT IN AERS: 2.0000 | INHALATION_SPRAY | Freq: Four times a day (QID) | RESPIRATORY_TRACT | Status: DC | PRN

## 2020-06-19 MED ORDER — ONDANSETRON HCL 4 MG/2ML IJ SOLN: 4.0000 mg | Freq: Four times a day (QID) | INTRAMUSCULAR | Status: DC | PRN

## 2020-06-19 MED ORDER — ALBUTEROL SULFATE HFA 108 (90 BASE) MCG/ACT IN AERS
2.0000 | INHALATION_SPRAY | RESPIRATORY_TRACT | Status: DC | PRN
  Administered 2020-06-19: 2 via RESPIRATORY_TRACT
  Filled 2020-06-19: qty 6.7

## 2020-06-19 MED ORDER — IOHEXOL 350 MG/ML SOLN
60.0000 mL | Freq: Once | INTRAVENOUS | Status: AC | PRN
  Administered 2020-06-19: 60 mL via INTRAVENOUS

## 2020-06-19 MED ORDER — TRANEXAMIC ACID FOR INHALATION
500.0000 mg | Freq: Once | RESPIRATORY_TRACT | Status: AC
  Administered 2020-06-19: 500 mg via RESPIRATORY_TRACT
  Filled 2020-06-19: qty 10

## 2020-06-19 MED ORDER — FLAXSEED OIL 1000 MG PO CAPS: 1000.0000 mg | ORAL_CAPSULE | Freq: Every morning | ORAL | Status: DC

## 2020-06-19 MED ORDER — PANTOPRAZOLE SODIUM 40 MG PO TBEC
40.0000 mg | DELAYED_RELEASE_TABLET | Freq: Every day | ORAL | Status: DC
  Administered 2020-06-20: 40 mg via ORAL
  Filled 2020-06-19: qty 1

## 2020-06-19 MED ORDER — AMLODIPINE BESYLATE 10 MG PO TABS
10.0000 mg | ORAL_TABLET | Freq: Every day | ORAL | Status: DC
  Administered 2020-06-19 – 2020-06-20 (×2): 10 mg via ORAL
  Filled 2020-06-19: qty 2
  Filled 2020-06-19: qty 1

## 2020-06-19 MED ORDER — FLUTICASONE FUROATE-VILANTEROL 200-25 MCG/INH IN AEPB
1.0000 | INHALATION_SPRAY | Freq: Every day | RESPIRATORY_TRACT | Status: DC
  Filled 2020-06-19: qty 28

## 2020-06-19 MED ORDER — ACETAMINOPHEN 500 MG PO TABS
1000.0000 mg | ORAL_TABLET | Freq: Two times a day (BID) | ORAL | Status: DC
  Administered 2020-06-20: 1000 mg via ORAL
  Filled 2020-06-19 (×3): qty 2

## 2020-06-19 MED ORDER — LISINOPRIL 20 MG PO TABS
40.0000 mg | ORAL_TABLET | Freq: Every day | ORAL | Status: DC
  Administered 2020-06-19 – 2020-06-20 (×2): 40 mg via ORAL
  Filled 2020-06-19 (×2): qty 2

## 2020-06-19 MED ORDER — ESCITALOPRAM OXALATE 10 MG PO TABS
10.0000 mg | ORAL_TABLET | Freq: Every day | ORAL | Status: DC
  Administered 2020-06-19 – 2020-06-20 (×2): 10 mg via ORAL
  Filled 2020-06-19 (×2): qty 1

## 2020-06-19 MED ORDER — ONDANSETRON HCL 4 MG PO TABS: 4.0000 mg | ORAL_TABLET | Freq: Four times a day (QID) | ORAL | Status: DC | PRN

## 2020-06-19 MED ORDER — HYDROCERIN EX CREA
1.0000 "application " | TOPICAL_CREAM | Freq: Every day | CUTANEOUS | Status: DC
  Administered 2020-06-20: 1 via TOPICAL
  Filled 2020-06-19: qty 113

## 2020-06-19 MED ORDER — CALCIUM CARBONATE-VITAMIN D 500-200 MG-UNIT PO TABS
1.0000 | ORAL_TABLET | Freq: Every day | ORAL | Status: DC
  Administered 2020-06-20: 1 via ORAL
  Filled 2020-06-19 (×2): qty 1

## 2020-06-19 MED ORDER — SENNOSIDES-DOCUSATE SODIUM 8.6-50 MG PO TABS: 1.0000 | ORAL_TABLET | Freq: Every evening | ORAL | Status: DC | PRN

## 2020-06-19 MED ORDER — SODIUM CHLORIDE 0.9 % IV SOLN: INTRAVENOUS | Status: AC

## 2020-06-19 NOTE — ED Notes (Signed)
Patient transported to CT 

## 2020-06-19 NOTE — ED Notes (Signed)
Attempted report 

## 2020-06-19 NOTE — ED Notes (Signed)
Pt's room air saturation 88%. Placed patient on 2L [place]. O2 saturation 93%. ED MD at bedside.

## 2020-06-19 NOTE — ED Notes (Signed)
Attempted to give report 

## 2020-06-19 NOTE — Progress Notes (Addendum)
Received message from Isaias Sakai, Systems analyst, that Dr. Roosevelt Locks at [facility] ER request Dr. Tammi Klippel review films. Per Dr. Tammi Klippel a pulmonary consult for bronchoscopy/biopsy and maybe laser are needed then radiation. Paged Dr. Roosevelt Locks and requested return call. Also, private messaged Dr. Roosevelt Locks via EPIC with this information.

## 2020-06-19 NOTE — ED Notes (Signed)
[facility] called and transport arranged

## 2020-06-19 NOTE — H&P (Signed)
History and Physical    Henry Allen MLY:[id number] DOB: 1940/06/10 DOA: 06/19/2020  PCP: Redmond School, MD (Confirm with patient/family/NH records and if not entered, this has to be entered at [facility] point of entry) Patient coming from: Nursing home I have personally briefly reviewed patient's old medical records in [place]  Chief Complaint: Coughing up blood  HPI: Henry Allen is a 80 y.o. male with medical history significant of ex-smoker, HTN, HLD, CAD s/p CABG, presented with hemoptysis.  Patient reported has been coughing up occasionally with fresh red blood last 2 weeks, with newly developed wheezing.  He has been having on and off cough and wheezing for few weeks for which he has been taking PRN albuterol and Tussin with some help. No chest pain, no fever or chills.  No change in appetite and his weight remains stable.  Morning, patient started coughing a large amount fresh red blood mixed with clots nursing home and became hypoxic and shipped to ED.  ED Course: Hypoxic 88% on room air, and placed on oxygen and stabilized on 2 L.  CT angiogram showed large left helium invading left main bronchus.  Review of Systems: As per HPI otherwise 14 point review of systems negative.    Past Medical History:  Diagnosis Date  . Abdominal aortic aneurysm ([place]) 05/06/2008   7cm Infrarenal abdominal aortic aneurysm and bilateral common iliac aneurysms using a 20x10 bifurcated graft  . CAD (coronary artery disease) 2009   CABG   . Elevated PSA   . Hemorrhagic stroke ([place]) 1999  . Hyperlipemia   . Hypertension   . Renal artery stenosis (HCC)    he has minor atherosclerosis in the right renal artery but a 60-99% stenosis in the left renal artery.  . Stroke ([place])   . Vascular dementia ([place])   . Ventral hernia     Past Surgical History:  Procedure Laterality Date  . CARDIAC CATHETERIZATION     showed severe three-vessel coronary disease  . CORONARY ARTERY BYPASS GRAFT   11/02/2007   x4 by Dr Gilford Raid     reports that he has quit smoking. His smoking use included cigarettes. He smoked 1.00 pack per day. He has never used smokeless tobacco. No history on file for alcohol use and drug use.  Allergies  Allergen Reactions  . Morphine Other (See Comments)    Hallucinations- listed as an "allergy" on the [facility]      Family History  Problem Relation Age of Onset  . Heart attack Mother 64  . Heart attack Father 98  . Heart attack Brother        x2 bother at age 20     Prior to Admission medications   Medication Sig Start Date End Date Taking? Authorizing Provider  acetaminophen (TYLENOL) 500 MG tablet Take 1,000 mg by mouth 2 (two) times daily.   Yes [provider]  albuterol (VENTOLIN HFA) 108 (90 Base) MCG/ACT inhaler Inhale 2 puffs into the lungs every 6 (six) hours as needed for wheezing or shortness of breath. 01/20/20  Yes Shah, Pratik D, DO  amLODipine (NORVASC) 10 MG tablet Take 10 mg by mouth daily.   Yes [provider]  aspirin 81 MG tablet Take 81 mg by mouth daily. 01/24/20 06/19/20 Yes [provider]  Calcium Carb-Cholecalciferol (OYSTER CALCIUM/D3) 500-200 MG-UNIT TABS Take 1 tablet by mouth daily.   Yes [provider]  escitalopram (LEXAPRO) 10 MG tablet Take 10 mg by mouth daily.  Yes [provider]  Flaxseed, Linseed, (FLAXSEED OIL) 1000 MG CAPS Take 1,000 mg by mouth in the morning.   Yes [provider]  guaiFENesin-dextromethorphan (ROBITUSSIN DM) 100-10 MG/5ML syrup Take 10 mLs by mouth every 4 (four) hours as needed for cough. 01/20/20  Yes Shah, Pratik D, DO  lisinopril-hydrochlorothiazide (ZESTORETIC) 20-12.5 MG tablet Take 2 tablets by mouth daily.   Yes [provider]  metoprolol tartrate (LOPRESSOR) 25 MG tablet Take 25 mg by mouth 2 (two) times daily.   Yes [provider]  omeprazole (PRILOSEC) 20 MG capsule Take 1 capsule (20 mg total) by mouth  daily. Patient taking differently: Take 20 mg by mouth daily before breakfast. 01/23/20 02/22/20 Yes Johnson, Eldridge Dace, MD  Skin Protectants, Misc. (EUCERIN) cream Apply 1 application topically See admin instructions. Apply to both legs (lower halves) daily   Yes [provider]  zinc sulfate 220 (50 Zn) MG capsule Take 1 capsule (220 mg total) by mouth daily. Patient not taking: Reported on 06/19/2020 01/21/20   Heath Lark D, DO    Physical Exam: Vitals:   06/19/20 0808 06/19/20 0900 06/19/20 1000 06/19/20 1030  BP:  (!) 163/75 (!) 149/70 (!) 158/61  Pulse:  (!) 49 (!) 42 (!) 56  Resp:  (!) 23 18 17   Temp:      TempSrc:      SpO2: 93% 96% 96% 96%    Constitutional: NAD, calm, comfortable Vitals:   06/19/20 0808 06/19/20 0900 06/19/20 1000 06/19/20 1030  BP:  (!) 163/75 (!) 149/70 (!) 158/61  Pulse:  (!) 49 (!) 42 (!) 56  Resp:  (!) 23 18 17   Temp:      TempSrc:      SpO2: 93% 96% 96% 96%   Eyes: PERRL, lids and conjunctivae normal ENMT: Mucous membranes are dry. Posterior pharynx clear of any exudate or lesions.Normal dentition.  Neck: normal, supple, no masses, no thyromegaly Respiratory: Diminished breathing sound bilaterally, fixed wheezing on left mid field, no crackles.  Increasing respiratory effort. No accessory muscle use.  Cardiovascular: Regular rate and rhythm, no murmurs / rubs / gallops. No extremity edema. 2+ pedal pulses. No carotid bruits.  Abdomen: no tenderness, no masses palpated. No hepatosplenomegaly. Bowel sounds positive.  Large ventral hernia Musculoskeletal: no clubbing / cyanosis. No joint deformity upper and lower extremities. Good ROM, no contractures. Normal muscle tone.  Skin: no rashes, lesions, ulcers. No induration Neurologic: CN 2-12 grossly intact. Sensation intact, DTR normal. Strength 5/5 in all 4.  Psychiatric: Normal judgment and insight. Alert and oriented x 3. Normal mood.     Labs on Admission: I have personally reviewed  following labs and imaging studies  CBC: Recent Labs  Lab 06/19/20 0808  WBC 10.3  HGB 13.9  HCT 44.5  MCV 91.6  PLT 751   Basic Metabolic Panel: Recent Labs  Lab 06/19/20 0808  NA 141  K 3.6  CL 99  CO2 29  GLUCOSE 104*  BUN 25*  CREATININE 1.25*  CALCIUM 10.1   GFR: CrCl cannot be calculated (Unknown ideal weight.). Liver Function Tests: No results for input(s): AST, ALT, ALKPHOS, BILITOT, PROT, ALBUMIN in the last 168 hours. No results for input(s): LIPASE, AMYLASE in the last 168 hours. No results for input(s): AMMONIA in the last 168 hours. Coagulation Profile: No results for input(s): INR, PROTIME in the last 168 hours. Cardiac Enzymes: No results for input(s): CKTOTAL, CKMB, CKMBINDEX, TROPONINI in the last 168 hours. BNP (last 3  results) No results for input(s): PROBNP in the last 8760 hours. HbA1C: No results for input(s): HGBA1C in the last 72 hours. CBG: No results for input(s): GLUCAP in the last 168 hours. Lipid Profile: No results for input(s): CHOL, HDL, LDLCALC, TRIG, CHOLHDL, LDLDIRECT in the last 72 hours. Thyroid Function Tests: No results for input(s): TSH, T4TOTAL, FREET4, T3FREE, THYROIDAB in the last 72 hours. Anemia Panel: No results for input(s): VITAMINB12, FOLATE, FERRITIN, TIBC, IRON, RETICCTPCT in the last 72 hours. Urine analysis:    Component Value Date/Time   COLORURINE YELLOW 01/18/2020 2212   APPEARANCEUR HAZY (A) 01/18/2020 2212   LABSPEC 1.019 01/18/2020 2212   PHURINE 6.0 01/18/2020 2212   GLUCOSEU NEGATIVE 01/18/2020 2212   HGBUR NEGATIVE 01/18/2020 2212   BILIRUBINUR NEGATIVE 01/18/2020 2212   KETONESUR 5 (A) 01/18/2020 2212   PROTEINUR 100 (A) 01/18/2020 2212   UROBILINOGEN 0.2 07/23/2009 1659   NITRITE NEGATIVE 01/18/2020 2212   LEUKOCYTESUR NEGATIVE 01/18/2020 2212    Radiological Exams on Admission: CT Angio Chest PE W and/or Wo Contrast  Result Date: 06/19/2020 CLINICAL DATA:  Hemoptysis. EXAM: CT  ANGIOGRAPHY CHEST WITH CONTRAST TECHNIQUE: Multidetector CT imaging of the chest was performed using the standard protocol during bolus administration of intravenous contrast. Multiplanar CT image reconstructions and MIPs were obtained to evaluate the vascular anatomy. CONTRAST:  75mL OMNIPAQUE IOHEXOL 350 MG/ML SOLN COMPARISON:  10/31/2007 FINDINGS: Cardiovascular: Heart size upper normal. Coronary artery calcification is evident. Status post CABG. Atherosclerotic calcification is noted in the wall of the thoracic aorta. There is no filling defect within the opacified pulmonary arteries to suggest the presence of an acute pulmonary embolus. 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 Descending thoracic aorta measures 4.7 cm diameter just proximal to the hiatus. Mediastinum/Nodes: 13 mm short axis precarinal node identified. No right hilar lymphadenopathy. Ill-defined soft tissue mass identified in the left hilum measures 6.9 x 5.0 cm on image 76/5, circumferentially encasing the left lower lobe pulmonary artery. Lesion encases and attenuates the left distal mainstem bronchus and airways to the lingula and left lower lobe. Material identified in the mainstem bronchi, left greater than right, presumably secretions although invasion of the left distal mainstem bronchus and airways to the lingula and left lower lobe is not excluded. Lungs/Pleura: Centrilobular and paraseptal emphysema evident. Left hilar mass described above. Pleuroparenchymal scarring evident bilaterally no suspicious pulmonary nodule or mass in the right lung. No pulmonary edema or pleural effusion. Upper Abdomen: Abdominal aorta measures 5.9 x 4.7 cm in transverse orthogonal diameter at the level of the SMA. Low-attenuation in the central right kidney is similar to the remote study from 2009 and may represent central sinus cysts. Midline ventral hernia incompletely visualized but contains small bowel  and colon. Numerous calcified gallstones evident. Tiny hypodensity posterior segment II is stable since prior. Musculoskeletal: No worrisome lytic or sclerotic osseous abnormality. Review of the MIP images confirms the above findings. IMPRESSION: 1. No CT evidence for acute pulmonary embolus. 2. 6.9 x 5.0 cm soft tissue mass in the left hilum circumferentially encases and attenuates the left distal mainstem bronchus and airways to the lingula and left lower lobe. Material in the mainstem bronchi, left greater than right, presumably secretions although invasion of the left distal mainstem bronchus and airways to the lingula and left lower lobe is not excluded. 3. Mild mediastinal lymphadenopathy, concerning for metastatic involvement. 4. Aneurysmal dilatation distal descending thoracic aorta and proximal abdominal aorta.  5. Aneurysmal dilatation left coronary graft. 6. Cholelithiasis. 7. Midline ventral hernia incompletely visualized but contains small bowel and colon. 8. Aortic Atherosclerosis (ICD10-I70.0) and Emphysema (ICD10-J43.9). Electronically Signed   By: Misty Stanley M.D.   On: 06/19/2020 11:20   DG Chest Portable 1 View  Result Date: 06/19/2020 CLINICAL DATA:  Hematemesis CAD Former smoker EXAM: PORTABLE CHEST 1 VIEW COMPARISON:  01/18/2020 FINDINGS: Cardiomediastinal silhouette and pulmonary vasculature are within normal limits. Postop changes of CABG are again seen. Trace right pleural effusion. Lungs otherwise clear. IMPRESSION: Trace right pleural effusion. Otherwise no acute abnormality of the chest. Electronically Signed   By: Miachel Roux M.D.   On: 06/19/2020 08:36    EKG: Independently reviewed.  Sinus bradycardia  Assessment/Plan Active Problems:   Hemoptysis  (please populate well all problems here in Problem List. (For example, if patient is on BP meds at home and you resume or decide to hold them, it is a problem that needs to be her. 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  CT showed " secretion in the bronchus left> right" likely represent blood clot and blood. -Hold aspirin -Pulmonology informed and will see the patient this afternoon.  Family informed, and desire conservative management. Pulm to decide further workup and treatment plan, consider palliative care consult if no invasive measures desired.  Acute hypoxic respiratory failure -2nd to invasive lung mass into the left bronchus  Sinus bradycardia -Discontinue beta-blocker  HTN, uncontrolled -Resume home BP meds, -PRN hydralazine  Question of COPD -60+ years smoking, quit several years ago. -Add Elipta  CAD -No acute issue.  DVT prophylaxis: SCD Code Status: DNR Family Communication: Daughter Disposition Plan: Expect more than 2 midnight hospital stay for pulmonary workup Consults called: Pulm Admission status: Tele admit   Lequita Halt MD [facility] Pager [phone]  06/19/2020, 1:11 PM

## 2020-06-19 NOTE — ED Notes (Signed)
Pt currently coughing up blood into emesis bag. Pt cleaned of blood around neck and chin. Pt provided with clean emesis bag.

## 2020-06-19 NOTE — ED Triage Notes (Signed)
Pt presents to the ED from [facility] after coughing up a large blood clot last night and this morning. Pt not on blood thinners. Hx of COPD and asthma. Pt denies SOB and chest pain. VSS en route with EMS. Pt has dried blood around oral mucosa. Pt not coughing in triage. Wheezing assessed.

## 2020-06-19 NOTE — ED Notes (Signed)
Dinner Trays Ordered @ [phone].

## 2020-06-19 NOTE — Consult Note (Signed)
NAME:  Henry Allen, MRN:  [id number], DOB:  January 05, 1941, LOS: 0 ADMISSION DATE:  06/19/2020, CONSULTATION DATE:  06/19/2020 REFERRING MD:  Alvino Chapel, CHIEF COMPLAINT:  Hemoptysis, L bronchus mass  Brief History:  80 year old male former smoker presents 06/19/2020   With hemoptysis which started 1/27 pm. Large clots. He is on daily ASA 81 mg. No blood thinners.  PCCM have been asked to consult.  History of Present Illness:  80 year old male former smoker presents 06/19/2020   With hemoptysis which started 1/27 pm. Large clots.Sats are 88% on arrival. Denies any additional bleeding or nose bleeds, history of dementia. Marland Kitchen  He is on daily ASA 81 mg. No blood thinners. He  has been Covid vaccinated 12/2019. Covid testing is pending. Continued hemoptysis while in the ER.  Chest x-ray reassuring,  Trace right pleural effusion. Lungs otherwise clear. CT scan done in the ED  however  showed likely malignancy in the left mainstem bronchus.  This is likely the cause of the hemoptysis.  No known pulmonary malignancy prior to this, but previous Hx of smoking makes him high risk..  With hypoxia pt will be admitted  to the hospital by [facility].Goals of care will need to be addressed as unsure of how aggressive family and patient would like to be in regard to care. PCCM were called to consult.  Past Medical History:   Past Medical History:  Diagnosis Date  . Abdominal aortic aneurysm ([place]) 05/06/2008   7cm Infrarenal abdominal aortic aneurysm and bilateral common iliac aneurysms using a 20x10 bifurcated graft  . CAD (coronary artery disease) 2009   CABG   . Elevated PSA   . Hemorrhagic stroke ([place]) 1999  . Hyperlipemia   . Hypertension   . Renal artery stenosis (HCC)    he has minor atherosclerosis in the right renal artery but a 60-99% stenosis in the left renal artery.  . Stroke ([place])   . Vascular dementia ([place])   . Ventral hernia     Significant Hospital Events:  1/27 Hemoptysis started at home 1/28  Admission to [facility]  Consults:  1/28 PCCM  Procedures:    Significant Diagnostic Tests:  CTA Chest 06/19/2020 No CT evidence for acute pulmonary embolus. 6.9 x 5.0 cm soft tissue mass in the left hilum circumferentially encases and attenuates the left distal mainstem bronchus and airways to the lingula and left lower lobe. Material in the mainstem bronchi, left greater than right, presumably secretions although invasion of the left distal mainstem bronchus and airways to the lingula and left lower lobe is not excluded. Mild mediastinal lymphadenopathy, concerning for metastatic involvement.  Micro Data:  1/28 Covid Pending  Antimicrobials:   None  Interim History / Subjective:  I discussed the CT findings with the patient at the bedside. He does have history of dementia and I am uncertain to what degree he understands about these findings. I called the daughter, Loletha Carrow, and went over the results with her. She states that he has not wanted invasive medical procedures done in the past as he has had an abdominal hernia he has not wanted repaired. She did not think moving forward with a bronchoscopy and biopsy would be part of his wishes. She asked if hospice care would be appropriate and I told her I would get our palliative care team involved.  Patient denies any discomfort or trouble breathing at this time.   Objective   Blood pressure (!) 158/61, pulse (!) 56, temperature 98.6 F (  37 C), temperature source Oral, resp. rate 17, SpO2 96 %.       No intake or output data in the 24 hours ending 06/19/20 1326 There were no vitals filed for this visit.  Examination: General: elderly male, no acute distress HENT: [place]/AT, crusted blood on lips, moist oral mucosa, sclera anicteric Lungs: rhonchi on left. No wheezing or rales. Cardiovascular: rrr, s1s2, no murmurs Abdomen: soft, non-tender, hernia present Extremities: no edema, warm Neuro: alert & awake, moving all extremities GU: no  foley in place  Resolved Hospital Problem list     Assessment & Plan:  Hemoptysis  Left Hilar Mass In setting of left hilar mass measuring 6.9 x 5.0 cm that incases left lower pulmonary artery and history of smoking along with occupational asbestos exposures   Plan - No plan for bronchoscopy and biopsies at this time based on patient's previous wishes - Will try to collect sputum sample to send for cytology to try to obtain diagnosis - Requested that primary team consult Radiation oncology for possible palliative radiation therapy - Tranexamic Acid Nebs once - Palliative care consult for further care planning. Possible hospice.  Hypoxemia In setting of hemoptysis   Plan -Titrate oxygen for sats > 88% -CXR prn -Incentive spirometry - Albuterol and Breo inhalers per primary team   Labs   CBC: Recent Labs  Lab 06/19/20 0808  WBC 10.3  HGB 13.9  HCT 44.5  MCV 91.6  PLT 009    Basic Metabolic Panel: Recent Labs  Lab 06/19/20 0808  NA 141  K 3.6  CL 99  CO2 29  GLUCOSE 104*  BUN 25*  CREATININE 1.25*  CALCIUM 10.1   GFR: CrCl cannot be calculated (Unknown ideal weight.). Recent Labs  Lab 06/19/20 0808  WBC 10.3    Liver Function Tests: No results for input(s): AST, ALT, ALKPHOS, BILITOT, PROT, ALBUMIN in the last 168 hours. No results for input(s): LIPASE, AMYLASE in the last 168 hours. No results for input(s): AMMONIA in the last 168 hours.  ABG    Component Value Date/Time   PHART 7.436 01/18/2020 2220   PCO2ART 42.4 01/18/2020 2220   PO2ART 74.0 (L) 01/18/2020 2220   HCO3 27.8 01/18/2020 2220   TCO2 27.3 05/06/2008 1207   ACIDBASEDEF 1.0 11/02/2007 1019   O2SAT 95.1 01/18/2020 2220     Coagulation Profile: No results for input(s): INR, PROTIME in the last 168 hours.  Cardiac Enzymes: No results for input(s): CKTOTAL, CKMB, CKMBINDEX, TROPONINI in the last 168 hours.  HbA1C: Hgb A1c MFr Bld  Date/Time Value Ref Range Status  10/31/2007  08:12 AM   Final   6.0 (NOTE)   The ADA recommends the following therapeutic goals for glycemic   control related to Hgb A1C measurement:   Goal of Therapy:   < 7.0% Hgb A1C   Action Suggested:  > 8.0% Hgb A1C   Ref:  Diabetes Care, 22, Suppl. 1, 1999    CBG: No results for input(s): GLUCAP in the last 168 hours.  Review of Systems:   + for hemoptysis + for dyspnea  Past Medical History:  He,  has a past medical history of Abdominal aortic aneurysm ([place]) (05/06/2008), CAD (coronary artery disease) (2009), Elevated PSA, Hemorrhagic stroke ([place]) (1999), Hyperlipemia, Hypertension, Renal artery stenosis ([place]), Stroke [facility]), Vascular dementia ([place]), and Ventral hernia.   Surgical History:   Past Surgical History:  Procedure Laterality Date  . CARDIAC CATHETERIZATION     showed severe three-vessel coronary disease  .  CORONARY ARTERY BYPASS GRAFT  11/02/2007   x4 by Dr Gilford Raid     Social History:   reports that he has quit smoking. His smoking use included cigarettes. He smoked 1.00 pack per day. He has never used smokeless tobacco.   Family History:  His family history includes Heart attack in his brother; Heart attack (age of onset: 100) in his mother; Heart attack (age of onset: 33) in his father.   Allergies Allergies  Allergen Reactions  . Morphine Other (See Comments)    Hallucinations- listed as an "allergy" on the [facility]       Home Medications  Prior to Admission medications   Medication Sig Start Date End Date Taking? Authorizing Provider  acetaminophen (TYLENOL) 500 MG tablet Take 1,000 mg by mouth 2 (two) times daily.   Yes [provider]  albuterol (VENTOLIN HFA) 108 (90 Base) MCG/ACT inhaler Inhale 2 puffs into the lungs every 6 (six) hours as needed for wheezing or shortness of breath. 01/20/20  Yes Shah, Pratik D, DO  amLODipine (NORVASC) 10 MG tablet Take 10 mg by mouth daily.   Yes [provider]  aspirin 81 MG tablet Take 81 mg by mouth  daily. 01/24/20 06/19/20 Yes [provider]  Calcium Carb-Cholecalciferol (OYSTER CALCIUM/D3) 500-200 MG-UNIT TABS Take 1 tablet by mouth daily.   Yes [provider]  escitalopram (LEXAPRO) 10 MG tablet Take 10 mg by mouth daily.   Yes [provider]  Flaxseed, Linseed, (FLAXSEED OIL) 1000 MG CAPS Take 1,000 mg by mouth in the morning.   Yes [provider]  guaiFENesin-dextromethorphan (ROBITUSSIN DM) 100-10 MG/5ML syrup Take 10 mLs by mouth every 4 (four) hours as needed for cough. 01/20/20  Yes Shah, Pratik D, DO  lisinopril-hydrochlorothiazide (ZESTORETIC) 20-12.5 MG tablet Take 2 tablets by mouth daily.   Yes [provider]  metoprolol tartrate (LOPRESSOR) 25 MG tablet Take 25 mg by mouth 2 (two) times daily.   Yes [provider]  omeprazole (PRILOSEC) 20 MG capsule Take 1 capsule (20 mg total) by mouth daily. Patient taking differently: Take 20 mg by mouth daily before breakfast. 01/23/20 02/22/20 Yes Johnson, Eldridge Dace, MD  Skin Protectants, Misc. (EUCERIN) cream Apply 1 application topically See admin instructions. Apply to both legs (lower halves) daily   Yes [provider]  zinc sulfate 220 (50 Zn) MG capsule Take 1 capsule (220 mg total) by mouth daily. Patient not taking: Reported on 06/19/2020 01/21/20   Heath Lark D, DO     Freda Jackson, MD [facility] Office: [phone]   See Amion for Pager Details

## 2020-06-19 NOTE — Progress Notes (Signed)
Patient transferred from [facility], Admission provider notified.

## 2020-06-19 NOTE — ED Provider Notes (Signed)
[name]   CSN: [id number] Arrival date & time: 06/19/20  0757     History Chief Complaint  Patient presents with  . Hemoptysis    Henry Allen is a 80 y.o. male.  HPI Patient presents with hemoptysis.  Reportedly has been coughing up large amounts of blood last night.  Reportedly blood clots.  On 81 mg aspirin only.  No blood thinners.  Patient states he feels fine.  Reportedly had sats of 88% on room air.  Upon arrival sats were 88%.  Denies other bleeding.  Denies nosebleeds.  Does have a history of some dementia.  Thinks that he has had his Covid vaccination.  Reviewing records appears to have had Covid also back in August.    Past Medical History:  Diagnosis Date  . Abdominal aortic aneurysm ([place]) 05/06/2008   7cm Infrarenal abdominal aortic aneurysm and bilateral common iliac aneurysms using a 20x10 bifurcated graft  . CAD (coronary artery disease) 2009   CABG   . Elevated PSA   . Hemorrhagic stroke ([place]) 1999  . Hyperlipemia   . Hypertension   . Renal artery stenosis (HCC)    he has minor atherosclerosis in the right renal artery but a 60-99% stenosis in the left renal artery.  . Stroke ([place])   . Vascular dementia ([place])   . Ventral hernia     Patient Active Problem List   Diagnosis Date Noted  . Hemoptysis 06/19/2020  . AMS (altered mental status) 01/19/2020  . 2019 novel coronavirus disease (COVID-19) 01/19/2020  . Hyperlipemia   . Hypertension   . Symptomatic bradycardia   . Hypokalemia   . Elevated troponin   . Tobacco use   . CKD (chronic kidney disease) stage 3, GFR 30-59 ml/min (HCC)   . Vascular dementia ([place])   . CAD (coronary artery disease) 2009    Past Surgical History:  Procedure Laterality Date  . CARDIAC CATHETERIZATION     showed severe three-vessel coronary disease  . CORONARY ARTERY BYPASS GRAFT  11/02/2007   x4 by Dr Gilford Raid       Family History  Problem Relation  Age of Onset  . Heart attack Mother 89  . Heart attack Father 30  . Heart attack Brother        x2 bother at age 38    Social History   Tobacco Use  . Smoking status: Former Smoker    Packs/day: 1.00    Types: Cigarettes  . Smokeless tobacco: Never Used    Home Medications Prior to Admission medications   Medication Sig Start Date End Date Taking? Authorizing Provider  albuterol (VENTOLIN HFA) 108 (90 Base) MCG/ACT inhaler Inhale 2 puffs into the lungs every 6 (six) hours as needed for wheezing or shortness of breath. 01/20/20   Manuella Ghazi, Pratik D, DO  amLODipine (NORVASC) 10 MG tablet Take 10 mg by mouth daily.    [provider]  aspirin 81 MG tablet Take 81 mg by mouth daily.    [provider]  calcium-vitamin D (OSCAL WITH D) 500-200 MG-UNIT tablet Take 1 tablet by mouth.    [provider]  Flaxseed, Linseed, (FLAX SEED OIL) 1300 MG CAPS Take by mouth.    [provider]  guaiFENesin-dextromethorphan (ROBITUSSIN DM) 100-10 MG/5ML syrup Take 10 mLs by mouth every 4 (four) hours as needed for cough. 01/20/20   Manuella Ghazi, Pratik D, DO  lisinopril (PRINIVIL,ZESTRIL) 40 MG tablet Take 40 mg by  mouth 2 (two) times daily.    [provider]  metoprolol tartrate (LOPRESSOR) 25 MG tablet Take 25 mg by mouth 2 (two) times daily.    [provider]  omeprazole (PRILOSEC) 20 MG capsule Take 1 capsule (20 mg total) by mouth daily. 01/23/20 02/22/20  Johnson, Clanford L, MD  zinc sulfate 220 (50 Zn) MG capsule Take 1 capsule (220 mg total) by mouth daily. 01/21/20   Manuella Ghazi, Pratik D, DO    Allergies    Morphine  Review of Systems   Review of Systems  Constitutional: Negative for appetite change and fever.  HENT: Negative for congestion.   Respiratory: Positive for cough.        Hemoptysis  Gastrointestinal: Negative for abdominal pain.  Genitourinary: Negative for frequency.  Musculoskeletal: Negative for back pain.  Skin: Negative for rash.   Neurological: Negative for weakness.  Psychiatric/Behavioral: Negative for confusion.    Physical Exam Updated Vital Signs BP (!) 158/61   Pulse (!) 56   Temp 98.6 F (37 C) (Oral)   Resp 17   SpO2 96%   Physical Exam Vitals and nursing note reviewed.  HENT:     Head: Atraumatic.     Nose:     Comments: No epistaxis.    Mouth/Throat:     Comments: Dried blood in mouth. Eyes:     Pupils: Pupils are equal, round, and reactive to light.  Cardiovascular:     Rate and Rhythm: Bradycardia present.  Pulmonary:     Comments: Mild diffuse wheezes and prolonged expirations. Abdominal:     Tenderness: There is no abdominal tenderness.  Musculoskeletal:        General: No tenderness.     Cervical back: Neck supple.  Skin:    General: Skin is warm.     Capillary Refill: Capillary refill takes less than 2 seconds.  Neurological:     Mental Status: He is alert and oriented to person, place, and time.     ED Results / Procedures / Treatments   Labs (all labs ordered are listed, but only abnormal results are displayed) Labs Reviewed  BASIC METABOLIC PANEL - Abnormal; Notable for the following components:      Result Value   Glucose, Bld 104 (*)    BUN 25 (*)    Creatinine, Ser 1.25 (*)    GFR, Estimated 58 (*)    All other components within normal limits  SARS CORONAVIRUS 2 BY RT PCR [facility], [facility])  CBC    EKG EKG Interpretation  Date/Time:  Friday June 19 2020 08:06:29 EST Ventricular Rate:  48 PR Interval:    QRS Duration: 98 QT Interval:  465 QTC Calculation: 416 R Axis:   2 Text Interpretation: Sinus bradycardia Atrial premature complexes Left atrial enlargement LVH with secondary repolarization abnormality Confirmed by Davonna Belling [phone]) on 06/19/2020 8:08:05 AM   Radiology CT Angio Chest PE W and/or Wo Contrast  Result Date: 06/19/2020 CLINICAL DATA:  Hemoptysis. EXAM: CT ANGIOGRAPHY CHEST WITH CONTRAST  TECHNIQUE: Multidetector CT imaging of the chest was performed using the standard protocol during bolus administration of intravenous contrast. Multiplanar CT image reconstructions and MIPs were obtained to evaluate the vascular anatomy. CONTRAST:  87mL OMNIPAQUE IOHEXOL 350 MG/ML SOLN COMPARISON:  10/31/2007 FINDINGS: Cardiovascular: Heart size upper normal. Coronary artery calcification is evident. Status post CABG. Atherosclerotic calcification is noted in the wall of the thoracic aorta. There is no filling defect within the opacified  pulmonary arteries to suggest the presence of an acute pulmonary embolus. 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 Descending thoracic aorta measures 4.7 cm diameter just proximal to the hiatus. Mediastinum/Nodes: 13 mm short axis precarinal node identified. No right hilar lymphadenopathy. Ill-defined soft tissue mass identified in the left hilum measures 6.9 x 5.0 cm on image 76/5, circumferentially encasing the left lower lobe pulmonary artery. Lesion encases and attenuates the left distal mainstem bronchus and airways to the lingula and left lower lobe. Material identified in the mainstem bronchi, left greater than right, presumably secretions although invasion of the left distal mainstem bronchus and airways to the lingula and left lower lobe is not excluded. Lungs/Pleura: Centrilobular and paraseptal emphysema evident. Left hilar mass described above. Pleuroparenchymal scarring evident bilaterally no suspicious pulmonary nodule or mass in the right lung. No pulmonary edema or pleural effusion. Upper Abdomen: Abdominal aorta measures 5.9 x 4.7 cm in transverse orthogonal diameter at the level of the SMA. Low-attenuation in the central right kidney is similar to the remote study from 2009 and may represent central sinus cysts. Midline ventral hernia incompletely visualized but contains small bowel and colon. Numerous calcified  gallstones evident. Tiny hypodensity posterior segment II is stable since prior. Musculoskeletal: No worrisome lytic or sclerotic osseous abnormality. Review of the MIP images confirms the above findings. IMPRESSION: 1. No CT evidence for acute pulmonary embolus. 2. 6.9 x 5.0 cm soft tissue mass in the left hilum circumferentially encases and attenuates the left distal mainstem bronchus and airways to the lingula and left lower lobe. Material in the mainstem bronchi, left greater than right, presumably secretions although invasion of the left distal mainstem bronchus and airways to the lingula and left lower lobe is not excluded. 3. Mild mediastinal lymphadenopathy, concerning for metastatic involvement. 4. Aneurysmal dilatation distal descending thoracic aorta and proximal abdominal aorta. 5. Aneurysmal dilatation left coronary graft. 6. Cholelithiasis. 7. Midline ventral hernia incompletely visualized but contains small bowel and colon. 8. Aortic Atherosclerosis (ICD10-I70.0) and Emphysema (ICD10-J43.9). Electronically Signed   By: Misty Stanley M.D.   On: 06/19/2020 11:20   DG Chest Portable 1 View  Result Date: 06/19/2020 CLINICAL DATA:  Hematemesis CAD Former smoker EXAM: PORTABLE CHEST 1 VIEW COMPARISON:  01/18/2020 FINDINGS: Cardiomediastinal silhouette and pulmonary vasculature are within normal limits. Postop changes of CABG are again seen. Trace right pleural effusion. Lungs otherwise clear. IMPRESSION: Trace right pleural effusion. Otherwise no acute abnormality of the chest. Electronically Signed   By: Miachel Roux M.D.   On: 06/19/2020 08:36    Procedures Procedures   Medications Ordered in ED Medications  albuterol (VENTOLIN HFA) 108 (90 Base) MCG/ACT inhaler 2 puff (2 puffs Inhalation Given 06/19/20 0916)  iohexol (OMNIPAQUE) 350 MG/ML injection 60 mL (60 mLs Intravenous Contrast Given 06/19/20 1055)    ED Course  I have reviewed the triage vital signs and the nursing  notes.  Pertinent labs & imaging results that were available during my care of the patient were reviewed by me and considered in my medical decision making (see chart for details).    MDM Rules/Calculators/A&P                          Patient presents with mild hypoxia and hemoptysis.  Continued hemoptysis while in the ER.  Chest x-ray reassuring.  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  This  is likely the cause of the hemoptysis.  No known pulmonary malignancy prior to this.  With hypoxia feels the patient benefit from [place] to the hospital.  Although I discussed the patient and the daughter and him not sure how aggressive they want to be.  Discussed with Dr. Roosevelt Locks from internal medicine and also discussed with pulmonary who will see patient.  Admit to internal medicine Final Clinical Impression(s) / ED Diagnoses Final diagnoses:  Hemoptysis  Lung mass    Rx / DC Orders ED Discharge Orders    None       Davonna Belling, MD 06/19/20 1233

## 2020-06-19 NOTE — Progress Notes (Signed)
D/W pulm and radiation oncology, plan for palliative radiation earlier next week at [facility]. Will change admission to [facility]. Talked to pt's daughter Loletha Carrow over phone who expressed understanding.

## 2020-06-20 DIAGNOSIS — C3402 Malignant neoplasm of left main bronchus: Secondary | ICD-10-CM | POA: Diagnosis not present

## 2020-06-20 DIAGNOSIS — M2550 Pain in unspecified joint: Secondary | ICD-10-CM | POA: Diagnosis not present

## 2020-06-20 DIAGNOSIS — Z7401 Bed confinement status: Secondary | ICD-10-CM | POA: Diagnosis not present

## 2020-06-20 DIAGNOSIS — R0602 Shortness of breath: Secondary | ICD-10-CM | POA: Diagnosis not present

## 2020-06-20 DIAGNOSIS — R042 Hemoptysis: Secondary | ICD-10-CM | POA: Diagnosis not present

## 2020-06-20 DIAGNOSIS — R410 Disorientation, unspecified: Secondary | ICD-10-CM | POA: Diagnosis not present

## 2020-06-20 DIAGNOSIS — Z743 Need for continuous supervision: Secondary | ICD-10-CM | POA: Diagnosis not present

## 2020-06-20 LAB — GLUCOSE, CAPILLARY
Glucose-Capillary: 108 mg/dL — ABNORMAL HIGH (ref 70–99)
Glucose-Capillary: 103 mg/dL — ABNORMAL HIGH (ref 70–99)

## 2020-06-20 LAB — BASIC METABOLIC PANEL
Anion gap: 11 (ref 5–15)
BUN: 20 mg/dL (ref 8–23)
CO2: 27 mmol/L (ref 22–32)
Calcium: 9.7 mg/dL (ref 8.9–10.3)
Chloride: 102 mmol/L (ref 98–111)
Creatinine, Ser: 0.84 mg/dL (ref 0.61–1.24)
GFR, Estimated: >60 mL/min (ref >60)
Glucose, Bld: 112 mg/dL — ABNORMAL HIGH (ref 70–99)
Potassium: 3.7 mmol/L (ref 3.5–5.1)
Sodium: 140 mmol/L (ref 135–145)

## 2020-06-20 LAB — GLUCOSE, POCT (MANUAL RESULT ENTRY): POC Glucose: 108 mg/dl — AB (ref 70–99)

## 2020-06-20 LAB — CBC
HCT: 42.7 % (ref 39.0–52.0)
Hemoglobin: 13.9 g/dL (ref 13.0–17.0)
MCH: 28.9 pg (ref 26.0–34.0)
MCHC: 32.6 g/dL (ref 30.0–36.0)
MCV: 88.8 fL (ref 80.0–100.0)
Platelets: 228 10*3/uL (ref 150–400)
RBC: 4.81 MIL/uL (ref 4.22–5.81)
RDW: 13.7 % (ref 11.5–15.5)
WBC: 10.6 10*3/uL — ABNORMAL HIGH (ref 4.0–10.5)
nRBC: 0 % (ref 0.0–0.2)

## 2020-06-20 MED ORDER — BENZONATATE 200 MG PO CAPS: 200.0000 mg | ORAL_CAPSULE | Freq: Three times a day (TID) | ORAL | 0 refills | Status: AC

## 2020-06-20 MED ORDER — FLUTICASONE FUROATE-VILANTEROL 200-25 MCG/INH IN AEPB: 1.0000 | INHALATION_SPRAY | Freq: Every day | RESPIRATORY_TRACT | Status: AC

## 2020-06-20 NOTE — Evaluation (Signed)
Physical Therapy Evaluation Patient Details Name: Henry Allen MRN: [id number] DOB: Jul 25, 1940 Today's Date: 06/20/2020   History of Present Illness  80 y.o. 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  Pt currently with functional limitations due to the deficits listed below (see PT Problem List). Pt will benefit from skilled PT to increase their independence and safety with mobility to allow discharge to the venue listed below.   Pt very tangential and talkative, requiring increased time to complete tasks.  Pt requiring supplemental oxygen for physical activities at this time.  Pt can likely d/c back to facility however would recommend SNF if they cannot provide current level of care.  Pt would benefit from f/u PT upon d/c.     Follow Up Recommendations SNF    Equipment Recommendations  None recommended by PT    Recommendations for Other Services       Precautions / Restrictions Precautions Precautions: Fall Precaution Comments: monitor sats      Mobility  Bed Mobility Overal bed mobility: Needs Assistance Bed Mobility: Supine to Sit;Sit to Supine     Supine to sit: Supervision;HOB elevated Sit to supine: Supervision;HOB elevated        Transfers Overall transfer level: Needs assistance Equipment used: Rolling walker (2 wheeled) Transfers: Sit to/from Stand Sit to Stand: Min guard         General transfer comment: min/guard for safety, cues for hand placement  Ambulation/Gait Ambulation/Gait assistance: Min guard Gait Distance (Feet): 160 Feet Assistive device: Rolling walker (2 wheeled) Gait Pattern/deviations: Step-through pattern;Decreased stride length     General Gait Details: steady with RW however  reports moderate dyspnea, SpO2 84% on room air so returned to room to reapply 2L [place] and SPO2 92%  Stairs            Wheelchair Mobility    Modified Rankin (Stroke Patients Only)       Balance Overall balance assessment: Needs assistance         Standing balance support: No upper extremity supported Standing balance-Leahy Scale: Fair                               Pertinent Vitals/Pain Pain Assessment: No/denies pain    Home Living Family/patient expects to be discharged to:: Skilled nursing facility                      Prior Function Level of Independence: Independent with assistive device(s)         Comments: pt reports use of RW however states he doesn't need it; reports independence with ADLs, ?accuracy with hx of vascular dementia - pt unable to name his facility but mentions his roommate that uses a w/c     Hand Dominance        Extremity/Trunk Assessment        Lower Extremity Assessment Lower Extremity Assessment: Generalized weakness    Cervical / Trunk Assessment Cervical / Trunk Assessment: Normal  Communication   Communication: No difficulties  Cognition Arousal/Alertness: Awake/alert Behavior During Therapy: WFL for tasks assessed/performed Overall Cognitive Status: History of cognitive impairments - at baseline  General Comments: very tangential and talkative      General Comments      Exercises     Assessment/Plan    PT Assessment Patient needs continued PT services  PT Problem List Decreased strength;Decreased mobility;Decreased activity tolerance;Decreased knowledge of use of DME;Decreased balance;Cardiopulmonary status limiting activity       PT Treatment Interventions DME instruction;Gait training;Balance training;Therapeutic exercise;Functional mobility training;Therapeutic activities;Patient/family education    PT Goals (Current goals can be found in  the Care Plan section)  Acute Rehab PT Goals PT Goal Formulation: With patient Time For Goal Achievement: 07/03/20 Potential to Achieve Goals: Good    Frequency Min 2X/week   Barriers to discharge        Co-evaluation               AM-PAC PT "6 Clicks" Mobility  Outcome Measure Help needed turning from your back to your side while in a flat bed without using bedrails?: A Little Help needed moving from lying on your back to sitting on the side of a flat bed without using bedrails?: A Little Help needed moving to and from a bed to a chair (including a wheelchair)?: A Little Help needed standing up from a chair using your arms (e.g., wheelchair or bedside chair)?: A Little Help needed to walk in hospital room?: A Little Help needed climbing 3-5 steps with a railing? : A Lot 6 Click Score: 17    End of Session Equipment Utilized During Treatment: Gait belt;Oxygen Activity Tolerance: Patient tolerated treatment well Patient left: in bed;with call bell/phone within reach;with bed alarm set   PT Visit Diagnosis: Difficulty in walking, not elsewhere classified (R26.2)    Time: 1610-9604 PT Time Calculation (min) (ACUTE ONLY): 33 min   Charges:   PT Evaluation $PT Eval Low Complexity: 1 Low PT Treatments $Gait Training: 8-22 mins       Jannette Spanner PT, DPT Acute Rehabilitation Services Pager: [phone] Office: [phone]  Jehan Ranganathan,KATHrine E 06/20/2020, 1:28 PM

## 2020-06-20 NOTE — Discharge Summary (Signed)
Physician Discharge Summary  THEOTIS GERDEMAN KXF:[id number] DOB: Mar 06, 1941 DOA: 06/19/2020  PCP: Redmond School, MD  Admit date: 06/19/2020 Discharge date: 06/20/2020  Admitted From: [facility] Discharge disposition: [facility]   Code Status: DNR  Diet Recommendation: Regular diet  Discharge Diagnosis:   Active Problems:   Hemoptysis   Lung cancer [facility])  Chief Complaint  Patient presents with  . Hemoptysis   Brief narrative: Henry Allen is a 80 y.o. male with PMH significant for HTN, HLD, CAD s/p CABG,  renal artery stenosis, stroke, vascular dementia, history of smoking and abdominal aortic aneurysm. Patient presented to the ED on the morning of 1/28 for hemoptysis which started the night before. Patient apparently was coughing up a small amount of fresh blood for 2 weeks with on and off wheezing with partial relief with as needed albuterol and Tussin.  Hemoptysis got worse overnight and hence presented to the ED on 1/28.  In the ED, patient was hypoxic to 88% and required 2 L oxygen by nasal cannula. CT scan of chest showed a large left hilum mass measuring 6.9 cm x 5 cm invading left main bronchus. Patient was admitted to hospitalist service. PCCM was consulted.  Subjective: Patient was seen and examined this morning.  Pleasant elderly male.  Not in distress.  Knows he is in the hospital. Chart reviewed No fever, heart rate normal and stable, blood pressure elevated to 160s this morning, oxygen saturation more than 90% 2 L/min  Assessment/Plan: Left hilar mass Hemoptysis -PCCM consult appreciated. -Patient is DNR and does not wish to get bronchoscopy and biopsy. -Radiation oncology consulted for palliative radiation.  I discussed with radiation oncologist Dr. Isidore Moos today.  Patient is planned for radiation next week but not yet any scheduled.  Since patient is asymptomatic at this time, we will discharge him back to [facility].  Patient/facility will be  called with schedule of radiation on Monday. -I discussed this with patient's daughter Ms. Jocelyn Lamer on the phone.  Acute respiratory failure with hypoxia -In the setting of hemoptysis. -On 2 L oxygen nasal cannula.  Discharge with oxygen.  Essential hypertension -Home meds include amlodipine 10 mg daily, lisinopril 20 mg daily, HCTZ 12.5 mg daily, metoprolol 25 mg daily. -Continue the same post discharge.  Stable for discharge back to [facility] today. Wound care:    Discharge Exam:   Vitals:   06/19/20 2210 06/20/20 0203 06/20/20 0645 06/20/20 1050  BP: (!) 166/59 (!) 155/71 (!) 164/79 122/79  Pulse: 70 65 75 70  Resp: (P) 18 18 16    Temp: 98.6 F (37 C) 98.6 F (37 C) 97.9 F (36.6 C) 98.4 F (36.9 C)  TempSrc: Oral Oral Oral Oral  SpO2: 92% 92% 94% 93%  Weight: 76.5 kg     Height: 5\' 8"  (1.727 m)       Body mass index is 25.64 kg/m.  General exam: Pleasant, elderly male.  Not in distress Skin: No rashes, lesions or ulcers. HEENT: Atraumatic, normocephalic, no obvious bleeding Lungs: Clear to auscultation bilaterally CVS: Regular rate and rhythm, no murmur GI/Abd soft, nontender, nondistended, bowel sound present CNS: Alert, awake, oriented to place, not fully oriented but talkative.  Not restless or agitated Psychiatry: Mood appropriate Extremities: No pedal edema, no calf tenderness  Follow ups:   Discharge Instructions    Increase activity slowly   Complete by: As directed       Follow-up Information    Redmond School, MD Follow up.  Specialty: Internal Medicine Contact information: [address] [place] [address] [phone]               Recommendations for Outpatient Follow-Up:   1. Follow-up with PCP as an outpatient  Discharge Instructions:  Follow with Primary MD Redmond School, MD in 7 days   Get CBC/BMP checked in next visit within 1 week by PCP or SNF MD ( we routinely change or add medications that can affect your  baseline labs and fluid status, therefore we recommend that you get the mentioned basic workup next visit with your PCP, your PCP may decide not to get them or add new tests based on their clinical decision)  On your next visit with your PCP, please Get Medicines reviewed and adjusted.  Please request your PCP  to go over all Hospital Tests and Procedure/Radiological results at the follow up, please get all Hospital records sent to your Prim MD by signing hospital release before you go home.  Activity: As tolerated with Full fall precautions use walker/cane & assistance as needed  For Heart failure patients - Check your Weight same time everyday, if you gain over 2 pounds, or you develop in leg swelling, experience more shortness of breath or chest pain, call your Primary MD immediately. Follow Cardiac Low Salt Diet and 1.5 lit/day fluid restriction.  If you have smoked or chewed Tobacco in the last 2 yrs please stop smoking, stop any regular Alcohol  and or any Recreational drug use.  If you experience worsening of your admission symptoms, develop shortness of breath, life threatening emergency, suicidal or homicidal thoughts you must seek medical attention immediately by calling 911 or calling your MD immediately  if symptoms less severe.  You Must read complete instructions/literature along with all the possible adverse reactions/side effects for all the Medicines you take and that have been prescribed to you. Take any new Medicines after you have completely understood and accpet all the possible adverse reactions/side effects.   Do not drive, operate heavy machinery, perform activities at heights, swimming or participation in water activities or provide baby sitting services if your were admitted for syncope or siezures until you have seen by Primary MD or a Neurologist and advised to do so again.  Do not drive when taking Pain medications.  Do not take more than prescribed Pain, Sleep and  Anxiety Medications  Wear Seat belts while driving.   Please note You were cared for by a hospitalist during your hospital stay. If you have any questions about your discharge medications or the care you received while you were in the hospital after you are discharged, you can call the unit and asked to speak with the hospitalist on call if the hospitalist that took care of you is not available. Once you are discharged, your primary care physician will handle any further medical issues. Please note that NO REFILLS for any discharge medications will be authorized once you are discharged, as it is imperative that you return to your primary care physician (or establish a relationship with a primary care physician if you do not have one) for your aftercare needs so that they can reassess your need for medications and monitor your lab values.    Allergies as of 06/20/2020      Reactions   Morphine Other (See Comments)   Hallucinations- listed as an "allergy" on the [facility]      Medication List    STOP taking these medications   aspirin 81  MG tablet     TAKE these medications   acetaminophen 500 MG tablet Commonly known as: TYLENOL Take 1,000 mg by mouth 2 (two) times daily.   albuterol 108 (90 Base) MCG/ACT inhaler Commonly known as: VENTOLIN HFA Inhale 2 puffs into the lungs every 6 (six) hours as needed for wheezing or shortness of breath.   amLODipine 10 MG tablet Commonly known as: NORVASC Take 10 mg by mouth daily.   benzonatate 200 MG capsule Commonly known as: TESSALON Take 1 capsule (200 mg total) by mouth 3 (three) times daily.   escitalopram 10 MG tablet Commonly known as: LEXAPRO Take 10 mg by mouth daily.   eucerin cream Apply 1 application topically See admin instructions. Apply to both legs (lower halves) daily   Flaxseed Oil 1000 MG Caps Take 1,000 mg by mouth in the morning.   fluticasone furoate-vilanterol 200-25 MCG/INH Aepb Commonly known as: BREO  ELLIPTA Inhale 1 puff into the lungs daily. Start taking on: June 21, 2020   guaiFENesin-dextromethorphan 100-10 MG/5ML syrup Commonly known as: ROBITUSSIN DM Take 10 mLs by mouth every 4 (four) hours as needed for cough.   lisinopril-hydrochlorothiazide 20-12.5 MG tablet Commonly known as: ZESTORETIC Take 2 tablets by mouth daily.   metoprolol tartrate 25 MG tablet Commonly known as: LOPRESSOR Take 25 mg by mouth 2 (two) times daily.   omeprazole 20 MG capsule Commonly known as: PriLOSEC Take 1 capsule (20 mg total) by mouth daily. What changed: when to take this   Oyster Calcium/D3 500-200 MG-UNIT Tabs Generic drug: Calcium Carb-Cholecalciferol Take 1 tablet by mouth daily.   zinc sulfate 220 (50 Zn) MG capsule Take 1 capsule (220 mg total) by mouth daily.       Time coordinating discharge: 35 minutes  The results of significant diagnostics from this hospitalization (including imaging, microbiology, ancillary and laboratory) are listed below for reference.    Procedures and Diagnostic Studies:   CT Angio Chest PE W and/or Wo Contrast  Result Date: 06/19/2020 CLINICAL DATA:  Hemoptysis. EXAM: CT ANGIOGRAPHY CHEST WITH CONTRAST TECHNIQUE: Multidetector CT imaging of the chest was performed using the standard protocol during bolus administration of intravenous contrast. Multiplanar CT image reconstructions and MIPs were obtained to evaluate the vascular anatomy. CONTRAST:  58mL OMNIPAQUE IOHEXOL 350 MG/ML SOLN COMPARISON:  10/31/2007 FINDINGS: Cardiovascular: Heart size upper normal. Coronary artery calcification is evident. Status post CABG. Atherosclerotic calcification is noted in the wall of the thoracic aorta. There is no filling defect within the opacified pulmonary arteries to suggest the presence of an acute pulmonary embolus. 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 Descending thoracic aorta measures 4.7 cm diameter  just proximal to the hiatus. Mediastinum/Nodes: 13 mm short axis precarinal node identified. No right hilar lymphadenopathy. Ill-defined soft tissue mass identified in the left hilum measures 6.9 x 5.0 cm on image 76/5, circumferentially encasing the left lower lobe pulmonary artery. Lesion encases and attenuates the left distal mainstem bronchus and airways to the lingula and left lower lobe. Material identified in the mainstem bronchi, left greater than right, presumably secretions although invasion of the left distal mainstem bronchus and airways to the lingula and left lower lobe is not excluded. Lungs/Pleura: Centrilobular and paraseptal emphysema evident. Left hilar mass described above. Pleuroparenchymal scarring evident bilaterally no suspicious pulmonary nodule or mass in the right lung. No pulmonary edema or pleural effusion. Upper Abdomen: Abdominal aorta measures 5.9 x 4.7 cm in transverse orthogonal  diameter at the level of the SMA. Low-attenuation in the central right kidney is similar to the remote study from 2009 and may represent central sinus cysts. Midline ventral hernia incompletely visualized but contains small bowel and colon. Numerous calcified gallstones evident. Tiny hypodensity posterior segment II is stable since prior. Musculoskeletal: No worrisome lytic or sclerotic osseous abnormality. Review of the MIP images confirms the above findings. IMPRESSION: 1. No CT evidence for acute pulmonary embolus. 2. 6.9 x 5.0 cm soft tissue mass in the left hilum circumferentially encases and attenuates the left distal mainstem bronchus and airways to the lingula and left lower lobe. Material in the mainstem bronchi, left greater than right, presumably secretions although invasion of the left distal mainstem bronchus and airways to the lingula and left lower lobe is not excluded. 3. Mild mediastinal lymphadenopathy, concerning for metastatic involvement. 4. 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 5. Aneurysmal dilatation left coronary graft. 6. Cholelithiasis. 7. Midline ventral hernia incompletely visualized but contains small bowel and colon. 8. Aortic Atherosclerosis (ICD10-I70.0) and Emphysema (ICD10-J43.9). Electronically Signed   By: Misty Stanley M.D.   On: 06/19/2020 11:20   DG Chest Portable 1 View  Result Date: 06/19/2020 CLINICAL DATA:  Hematemesis CAD Former smoker EXAM: PORTABLE CHEST 1 VIEW COMPARISON:  01/18/2020 FINDINGS: Cardiomediastinal silhouette and pulmonary vasculature are within normal limits. Postop changes of CABG are again seen. Trace right pleural effusion. Lungs otherwise clear. IMPRESSION: Trace right pleural effusion. Otherwise no acute abnormality of the chest. Electronically Signed   By: Miachel Roux M.D.   On: 06/19/2020 08:36     Labs:   Basic Metabolic Panel: Recent Labs  Lab 06/19/20 0808 06/20/20 0659  NA 141 140  K 3.6 3.7  CL 99 102  CO2 29 27  GLUCOSE 104* 112*  BUN 25* 20  CREATININE 1.25* 0.84  CALCIUM 10.1 9.7   GFR Estimated Creatinine Clearance: 67.9 mL/min (by C-G formula based on SCr of 0.84 mg/dL). Liver Function Tests: No results for input(s): AST, ALT, ALKPHOS, BILITOT, PROT, ALBUMIN in the last 168 hours. No results for input(s): LIPASE, AMYLASE in the last 168 hours. No results for input(s): AMMONIA in the last 168 hours. Coagulation profile Recent Labs  Lab 06/19/20 1337  INR 1.0    CBC: Recent Labs  Lab 06/19/20 0808 06/20/20 0659  WBC 10.3 10.6*  HGB 13.9 13.9  HCT 44.5 42.7  MCV 91.6 88.8  PLT 238 228   Cardiac Enzymes: No results for input(s): CKTOTAL, CKMB, CKMBINDEX, TROPONINI in the last 168 hours. BNP: Invalid input(s): POCBNP CBG: Recent Labs  Lab 06/19/20 2226 06/20/20 0819 06/20/20 1222  GLUCAP 117* 108* 103*   D-Dimer No results for input(s): DDIMER in the last 72 hours. Hgb A1c No results for input(s): HGBA1C in the last 72  hours. Lipid Profile No results for input(s): CHOL, HDL, LDLCALC, TRIG, CHOLHDL, LDLDIRECT in the last 72 hours. Thyroid function studies No results for input(s): TSH, T4TOTAL, T3FREE, THYROIDAB in the last 72 hours.  Invalid input(s): FREET3 Anemia work up No results for input(s): VITAMINB12, FOLATE, FERRITIN, TIBC, IRON, RETICCTPCT in the last 72 hours. Microbiology Recent Results (from the past 240 hour(s))  SARS Coronavirus 2 by RT PCR (hospital order, performed in [facility] hospital lab) Nasopharyngeal Nasopharyngeal Swab     Status: None   Collection Time: 06/19/20 12:13 PM   Specimen: Nasopharyngeal Swab  Result Value Ref Range Status   SARS Coronavirus 2 NEGATIVE NEGATIVE Final  Comment: (NOTE) SARS-CoV-2 target nucleic acids are NOT DETECTED.  The SARS-CoV-2 RNA is generally detectable in upper and lower respiratory specimens during the acute phase of infection. The lowest concentration of SARS-CoV-2 viral copies this assay can detect is 250 copies / mL. A negative result does not preclude SARS-CoV-2 infection and should not be used as the sole basis for treatment or other patient management decisions.  A negative result may occur with improper specimen collection / handling, submission of specimen other than nasopharyngeal swab, presence of viral mutation(s) within the areas targeted by this assay, and inadequate number of viral copies (<250 copies / mL). A negative result must be combined with clinical observations, patient history, and epidemiological information.  Fact Sheet for Patients:   StrictlyIdeas.no  Fact Sheet for Healthcare Providers: BankingDealers.co.za  This test is not yet approved or  cleared by the [place] FDA and has been authorized for detection and/or diagnosis of SARS-CoV-2 by FDA under an Emergency Use Authorization (EUA).  This EUA will remain in effect (meaning this test can be used)  for the duration of the COVID-19 declaration under Section 564(b)(1) of the Act, 21 U.S.C. section 360bbb-3(b)(1), unless the authorization is terminated or revoked sooner.  Performed at [facility], [place] [address], [place], [place] [address]      Signed: Terrilee Croak  [facility] 06/20/2020, 1:54 PM

## 2020-06-20 NOTE — Care Management Obs Status (Signed)
[facility] NOTIFICATION   Patient Details  Name: AYEDEN GLADMAN MRN: [id number] Date of Birth: 10-Apr-1941   Medicare Observation Status Notification Given:  Yes    Renn Dirocco, LCSW 06/20/2020, 2:29 PM

## 2020-06-20 NOTE — Care Management CC44 (Signed)
Condition Code 44 Documentation Completed  Patient Details  Name: Henry Allen MRN: [id number] Date of Birth: 09-28-1940   Condition Code 44 given:  Yes Patient signature on Condition Code 44 notice:  Yes Documentation of 2 MD's agreement:  Yes Code 44 added to claim:  Yes    Mckenna Boruff, LCSW 06/20/2020, 2:29 PM

## 2020-06-20 NOTE — Progress Notes (Signed)
Contacted by [employer], gave report to Shauna Hugh, RN.

## 2020-06-20 NOTE — Plan of Care (Signed)
  Problem: Education: Goal: Knowledge of patient specific risk factors addressed and post discharge goals established will improve Outcome: Progressing

## 2020-06-20 NOTE — Progress Notes (Signed)
Attempted to call report to [facility], no one available to take report at this time. Left message and phone number to call back when available.

## 2020-06-22 ENCOUNTER — Telehealth: Payer: Self-pay | Admitting: Radiation Oncology

## 2020-06-22 NOTE — Telephone Encounter (Signed)
Phoned [facility]. Left message for transportation scheduler to phone this RN promptly reference transporting patient to [facility] rad onc today at 1100. Awaiting return call.

## 2020-06-22 NOTE — Telephone Encounter (Signed)
-----   Message from Starr Sinclair sent at 06/22/2020  9:29 AM EST ----- Regarding: RE: Consult.Monday Dr. Tammi Klippel,  I paged you earlier to see what time will work best for you today.  Thank you,  Shay ----- Message ----- From: Heywood Footman, RN Sent: 06/22/2020   9:17 AM EST To: Tyler Pita, MD, Freeman Caldron, PA-C, # Subject: Consult.Monday                                 Everyone.   This patient is in need of empiric radiation for a large left hilum mass. He has been discharged back to [facility] with oxygen. His daughter, Loletha Carrow, appears to be the point of contact.   I am sending this inbasket because I thought it would be helpful to the admin staff when attempting to schedule his consult with Dr. Tammi Klippel.   Sam

## 2020-06-22 NOTE — Telephone Encounter (Signed)
Confirmed with Orland Dec at [facility] that transportation has been arranged for this patient's CT/Simulation appointment tomorrow at 1 pm. Phoned patient's daughter, Loletha Carrow, to update. Vickie confirms she is the decision maker for her father but she does reside in [place]. She explains she is a Marine scientist but feels she has none of the answers to provide to her family. She understands that [facility] will transfer her father over for simulation then back to the facility. Explained consent will need to be obtained from her prior to simulation tomorrow at [place] unable to provide a good contact time saying "just call and I will make it work." Vickie verbalized her understanding that any radiation treatments will be palliative and not curative. Vickie questions how many treatments her father will need of radiation and how those treatments will effect his quality of life. Deferred to Dr. Tammi Klippel. Vickie understands to expect a call from Dr. Tammi Klippel.

## 2020-06-22 NOTE — Telephone Encounter (Signed)
  Radiation Oncology         [phone] ________________________________  Name: Henry Allen MRN: [id number]  Date: 06/22/2020  DOB: 04/06/1941  Telephone contact:  I received a message to call this patient's daughter Loletha Carrow and returned the phone call.    I shared my summary of her father's recent hospital admission.  We discussed the rationale for empiric radiotherapy to the left hilar lung mass to palliate hemoptysis.  I outlined the risks and benefits of 10 treatments with radiation creating an 80% chance for hemostasis.  The primary toxicity would likely entail grade 1 or 2 esophagitis lasting up to two weeks after treatment.  Vickie understands that without treatment, hemoptysis may worsen creating a very poor quality of life and become life threatening.  At the end of our conversation, Vickie expressed support that we proceed with CT simulation tomorrow.  We will call her for verbal consent at that time.  I also assured her that we would call her back following simulation to report out on how it went, whether he was cooperative and whether we completed the procedure as planned.  Vickie plans to return to [place] on Thursday or Friday with her daughter, so, they can visit the patient.  She will plan to present on Friday for her father's 'on treatment visit' with me.   ________________________________  Sheral Apley. Tammi Klippel, M.D.

## 2020-06-23 ENCOUNTER — Other Ambulatory Visit: Payer: Self-pay

## 2020-06-23 ENCOUNTER — Ambulatory Visit
Admission: RE | Admit: 2020-06-23 | Discharge: 2020-06-23 | Disposition: A | Discharge status: Home / Self Care | Payer: Medicare Other | Source: Ambulatory Visit | Attending: Radiation Oncology | Admitting: Radiation Oncology

## 2020-06-23 ENCOUNTER — Ambulatory Visit: Payer: Medicare Other

## 2020-06-23 VITALS — BP 99/67 | HR 81 | Temp 98.1°F | Resp 20 | Ht 68.0 in | Wt 169.6 lb

## 2020-06-23 DIAGNOSIS — D381 Neoplasm of uncertain behavior of trachea, bronchus and lung: Secondary | ICD-10-CM | POA: Diagnosis not present

## 2020-06-23 DIAGNOSIS — Z87891 Personal history of nicotine dependence: Secondary | ICD-10-CM | POA: Diagnosis not present

## 2020-06-23 DIAGNOSIS — I719 Aortic aneurysm of unspecified site, without rupture: Secondary | ICD-10-CM | POA: Diagnosis not present

## 2020-06-23 DIAGNOSIS — R042 Hemoptysis: Secondary | ICD-10-CM

## 2020-06-23 DIAGNOSIS — C3402 Malignant neoplasm of left main bronchus: Secondary | ICD-10-CM

## 2020-06-23 DIAGNOSIS — R040 Epistaxis: Secondary | ICD-10-CM | POA: Insufficient documentation

## 2020-06-23 DIAGNOSIS — I2510 Atherosclerotic heart disease of native coronary artery without angina pectoris: Secondary | ICD-10-CM | POA: Diagnosis not present

## 2020-06-23 DIAGNOSIS — R918 Other nonspecific abnormal finding of lung field: Secondary | ICD-10-CM | POA: Diagnosis not present

## 2020-06-23 DIAGNOSIS — C3490 Malignant neoplasm of unspecified part of unspecified bronchus or lung: Secondary | ICD-10-CM | POA: Diagnosis not present

## 2020-06-23 NOTE — Progress Notes (Signed)
  Radiation Oncology         [phone] ________________________________  Name: Henry Allen MRN: [id number]  Date: 06/23/2020  DOB: 1940/10/05  SIMULATION AND TREATMENT PLANNING NOTE    ICD-10-CM   1. 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  Identity was confirmed.  All relevant records and images related to the planned course of therapy were reviewed.  The patient freely provided informed written consent to proceed with treatment after reviewing the details related to the planned course of therapy. The consent form was witnessed and verified by the simulation staff.  Then, the patient was set-up in a stable reproducible  supine position for radiation therapy.  CT images were obtained.  Surface markings were placed.  The CT images were loaded into the planning software.  Then the target and avoidance structures were contoured.  Treatment planning then occurred.  The radiation prescription was entered and confirmed.  Then, I designed and supervised the construction of a total of 6 medically necessary complex treatment devices, including a BodyFix immobilization mold custom fitted to the patient along with 5 multileaf collimators conformally shaped radiation around the treatment target while shielding critical structures such as the heart and spinal cord maximally.  I have requested : 3D Simulation  I have requested a DVH of the following structures: Left lung, right lung, spinal cord, heart, esophagus, and target.   PLAN:  The patient will receive 30Gy in 10 fractions for palliation of hemoptysis.  ________________________________  Sheral Apley Tammi Klippel, M.D.

## 2020-06-23 NOTE — Progress Notes (Signed)
Radiation Oncology         [phone] ________________________________  Initial outpatient Consultation  Name: Henry Allen MRN: [id number]  Date of Service: 06/23/2020 DOB: 1940-08-04  CC:Redmond School, MD  Terrilee Croak, MD   REFERRING PHYSICIAN: Terrilee Croak, MD  DIAGNOSIS: 80 year old male with new onset hemoptysis secondary to a 7 cm left hilar mass invading into the left mainstem bronchus, suspected primary malignancy.    ICD-10-CM   1. Hemoptysis  R04.2   2. 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 male seen at the request of Dr. Pietro Cassis. He presented to the ED on 06/19/2020 with new onset hemoptysis and intermittent wheezing which was progressively worsening since onset on 06/18/2020. He proceeded to angio chest CT at the time of admission confirming no acute pulmonary embolus but there was noted a 6.9 cm soft tissue mass in left hilum that circumferentially encases and attenuates the left distal mainstem bronchus and airways to lingula and left lower lobe with associated mild mediastinal lymphadenopathy.   He met with Dr. Erin Fulling the same day, who contacted the patient's daughter Loletha Carrow as the patient has a history of dementia. Per Vickie, the patient had previously declined invasive medical procedures, and she did not feel that moving forward with a bronchoscopy and biopsy would be part of his wishes and instead preferred to focus on comfort/palliative care.  He was discharged back to [facility], memory care unit on 06/20/2020 but we have been asked to consult for possible consideration of palliative radiotherapy for the purpose of hemostasis.  PREVIOUS RADIATION THERAPY: No  PAST MEDICAL HISTORY:  Past Medical History:  Diagnosis Date  . Abdominal aortic aneurysm ([place]) 05/06/2008   7cm Infrarenal abdominal aortic aneurysm and bilateral common iliac aneurysms using a 20x10 bifurcated graft  . CAD  (coronary artery disease) 2009   CABG   . Elevated PSA   . Hemorrhagic stroke ([place]) 1999  . Hyperlipemia   . Hypertension   . Renal artery stenosis (HCC)    he has minor atherosclerosis in the right renal artery but a 60-99% stenosis in the left renal artery.  . Stroke ([place])   . Vascular dementia ([place])   . Ventral hernia       PAST SURGICAL HISTORY: Past Surgical History:  Procedure Laterality Date  . CARDIAC CATHETERIZATION     showed severe three-vessel coronary disease  . CORONARY ARTERY BYPASS GRAFT  11/02/2007   x4 by Dr Gilford Raid    FAMILY HISTORY:  Family History  Problem Relation Age of Onset  . Heart attack Mother 40  . Heart attack Father 33  . Heart attack Brother        x2 bother at age 70    SOCIAL HISTORY:  Social History   Socioeconomic History  . Marital status: Married    Spouse name: Not on file  . Number of children: Not on file  . Years of education: Not on file  . Highest education level: Not on file  Occupational History  . Not on file  Tobacco Use  . Smoking status: Former Smoker    Packs/day: 1.00    Types: Cigarettes  . Smokeless tobacco: Never Used  Substance and Sexual Activity  . Alcohol use: Not on file  . Drug use: Not on file  . Sexual activity: Not on file  Other Topics Concern  . Not on file  Social History Narrative  . Not on file   Social Determinants of Health   Financial Resource Strain: Not on file  Food Insecurity: Not on file  Transportation Needs: Not on file  Physical Activity: Not on file  Stress: Not on file  Social Connections: Not on file  Intimate Partner Violence: Not on file    ALLERGIES: Morphine  MEDICATIONS:  Current Outpatient Medications  Medication Sig Dispense Refill  . acetaminophen (TYLENOL) 500 MG tablet Take 1,000 mg by mouth 2 (two) times daily.    Marland Kitchen albuterol (VENTOLIN HFA) 108 (90 Base) MCG/ACT inhaler Inhale 2 puffs into the lungs every 6 (six) hours as needed for wheezing or  shortness of breath. 8 g 0  . amLODipine (NORVASC) 10 MG tablet Take 10 mg by mouth daily.    . benzonatate (TESSALON) 200 MG capsule Take 1 capsule (200 mg total) by mouth 3 (three) times daily. 20 capsule 0  . Calcium Carb-Cholecalciferol (OYSTER CALCIUM/D3) 500-200 MG-UNIT TABS Take 1 tablet by mouth daily.    Marland Kitchen escitalopram (LEXAPRO) 10 MG tablet Take 10 mg by mouth daily.    . Flaxseed, Linseed, (FLAXSEED OIL) 1000 MG CAPS Take 1,000 mg by mouth in the morning.    . fluticasone furoate-vilanterol (BREO ELLIPTA) 200-25 MCG/INH AEPB Inhale 1 puff into the lungs daily.    Marland Kitchen guaiFENesin-dextromethorphan (ROBITUSSIN DM) 100-10 MG/5ML syrup Take 10 mLs by mouth every 4 (four) hours as needed for cough. 118 mL 0  . lisinopril-hydrochlorothiazide (ZESTORETIC) 20-12.5 MG tablet Take 2 tablets by mouth daily.    . metoprolol tartrate (LOPRESSOR) 25 MG tablet Take 25 mg by mouth 2 (two) times daily.    Marland Kitchen omeprazole (PRILOSEC) 20 MG capsule Take 1 capsule (20 mg total) by mouth daily. (Patient taking differently: Take 20 mg by mouth daily before breakfast.) 30 capsule 0  . Skin Protectants, Misc. (EUCERIN) cream Apply 1 application topically See admin instructions. Apply to both legs (lower halves) daily    . zinc sulfate 220 (50 Zn) MG capsule Take 1 capsule (220 mg total) by mouth daily. (Patient not taking: Reported on 06/19/2020) 30 capsule 0   No current facility-administered medications for this encounter.    REVIEW OF SYSTEMS:  On review of systems, the patient reports that he is doing well overall. He denies any chest pain, shortness of breath, fevers, chills, night sweats, or unintended weight changes.  He continues with a chronic cough but denies any recent hemoptysis since he left the hospital.  He denies any bowel or bladder disturbances, and denies abdominal pain, nausea or vomiting. He denies any new musculoskeletal or joint aches or pains. A complete review of systems is obtained and is  otherwise negative.   PHYSICAL EXAM:  Wt Readings from Last 3 Encounters:  06/23/20 169 lb 9.6 oz (76.9 kg)  06/19/20 168 lb 10.4 oz (76.5 kg)  01/18/20 187 lb (84.8 kg)   Temp Readings from Last 3 Encounters:  06/23/20 98.1 F (36.7 C)  06/20/20 98.4 F (36.9 C) (Oral)  01/24/20 98 F (36.7 C)   BP Readings from Last 3 Encounters:  06/23/20 99/67  06/20/20 122/79  01/24/20 (!) 157/77   Pulse Readings from Last 3 Encounters:  06/23/20 81  06/20/20 70  01/24/20 (!) 58    /10  In general this is a well appearing Caucasian man in no acute distress. He's alert and oriented to person and appropriate throughout the examination although his underlying dementia is obvious throughout our  conversation. Cardiopulmonary assessment is negative for acute distress and he exhibits normal effort.   KPS = 60  100 - Normal; no complaints; no evidence of disease. 90   - Able to carry on normal activity; minor signs or symptoms of disease. 80   - Normal activity with effort; some signs or symptoms of disease. 62   - Cares for self; unable to carry on normal activity or to do active work. 60   - Requires occasional assistance, but is able to care for most of his personal needs. 50   - Requires considerable assistance and frequent medical care. 40   - Disabled; requires special care and assistance. 30   - Severely disabled; hospital admission is indicated although death not imminent. 20   - Very sick; hospital admission necessary; active supportive treatment necessary. 10   - Moribund; fatal processes progressing rapidly. 0     - Dead  Karnofsky DA, Abelmann WH, Craver LS and [facility] [phone]) The use of the nitrogen mustards in the palliative treatment of carcinoma: with particular reference to bronchogenic carcinoma Cancer 1 634-56  LABORATORY DATA:  Lab Results  Component Value Date   WBC 10.6 (H) 06/20/2020   HGB 13.9 06/20/2020   HCT 42.7 06/20/2020   MCV 88.8 06/20/2020   PLT  228 06/20/2020   Lab Results  Component Value Date   NA 140 06/20/2020   K 3.7 06/20/2020   CL 102 06/20/2020   CO2 27 06/20/2020   Lab Results  Component Value Date   ALT 94 (H) 01/23/2020   AST 79 (H) 01/23/2020   ALKPHOS 46 01/23/2020   BILITOT 1.0 01/23/2020     RADIOGRAPHY: CT Angio Chest PE W and/or Wo Contrast  Result Date: 06/19/2020 CLINICAL DATA:  Hemoptysis. EXAM: CT ANGIOGRAPHY CHEST WITH CONTRAST TECHNIQUE: Multidetector CT imaging of the chest was performed using the standard protocol during bolus administration of intravenous contrast. Multiplanar CT image reconstructions and MIPs were obtained to evaluate the vascular anatomy. CONTRAST:  4mL OMNIPAQUE IOHEXOL 350 MG/ML SOLN COMPARISON:  10/31/2007 FINDINGS: Cardiovascular: Heart size upper normal. Coronary artery calcification is evident. Status post CABG. Atherosclerotic calcification is noted in the wall of the thoracic aorta. There is no filling defect within the opacified pulmonary arteries to suggest the presence of an acute pulmonary embolus. 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 Descending thoracic aorta measures 4.7 cm diameter just proximal to the hiatus. Mediastinum/Nodes: 13 mm short axis precarinal node identified. No right hilar lymphadenopathy. Ill-defined soft tissue mass identified in the left hilum measures 6.9 x 5.0 cm on image 76/5, circumferentially encasing the left lower lobe pulmonary artery. Lesion encases and attenuates the left distal mainstem bronchus and airways to the lingula and left lower lobe. Material identified in the mainstem bronchi, left greater than right, presumably secretions although invasion of the left distal mainstem bronchus and airways to the lingula and left lower lobe is not excluded. Lungs/Pleura: Centrilobular and paraseptal emphysema evident. Left hilar mass described above. Pleuroparenchymal scarring evident bilaterally no  suspicious pulmonary nodule or mass in the right lung. No pulmonary edema or pleural effusion. Upper Abdomen: Abdominal aorta measures 5.9 x 4.7 cm in transverse orthogonal diameter at the level of the SMA. Low-attenuation in the central right kidney is similar to the remote study from 2009 and may represent central sinus cysts. Midline ventral hernia incompletely visualized but contains small bowel and colon. Numerous calcified gallstones evident.  Tiny hypodensity posterior segment II is stable since prior. Musculoskeletal: No worrisome lytic or sclerotic osseous abnormality. Review of the MIP images confirms the above findings. IMPRESSION: 1. No CT evidence for acute pulmonary embolus. 2. 6.9 x 5.0 cm soft tissue mass in the left hilum circumferentially encases and attenuates the left distal mainstem bronchus and airways to the lingula and left lower lobe. Material in the mainstem bronchi, left greater than right, presumably secretions although invasion of the left distal mainstem bronchus and airways to the lingula and left lower lobe is not excluded. 3. Mild mediastinal lymphadenopathy, concerning for metastatic involvement. 4. Aneurysmal dilatation distal descending thoracic aorta and proximal abdominal aorta. 5. Aneurysmal dilatation left coronary graft. 6. Cholelithiasis. 7. Midline ventral hernia incompletely visualized but contains small bowel and colon. 8. Aortic Atherosclerosis (ICD10-I70.0) and Emphysema (ICD10-J43.9). Electronically Signed   By: Misty Stanley M.D.   On: 06/19/2020 11:20   DG Chest Portable 1 View  Result Date: 06/19/2020 CLINICAL DATA:  Hematemesis CAD Former smoker EXAM: PORTABLE CHEST 1 VIEW COMPARISON:  01/18/2020 FINDINGS: Cardiomediastinal silhouette and pulmonary vasculature are within normal limits. Postop changes of CABG are again seen. Trace right pleural effusion. Lungs otherwise clear. IMPRESSION: Trace right pleural effusion. Otherwise no acute abnormality of the chest.  Electronically Signed   By: Miachel Roux M.D.   On: 06/19/2020 08:36      IMPRESSION/PLAN: 66. 80 y.o. man with new onset hemoptysis secondary to a 7 cm left hilar mass invading into the left mainstem bronchus, suspected primary malignancy. Today, we discussed the findings and work-up thus far.  We discussed the rationale for palliative radiotherapy to the left hilar lung mass to palliate hemoptysis.  We outlined the risks and benefits associated with the recommended course of 10 daily treatments leading to an 80% chance for hemostasis.  We discussed the primary toxicity would likely entail grade 1 or 2 esophagitis lasting up to two weeks after treatment.    He and his daughter, Loletha Carrow appear to have a good understanding of his disease and the fact that without treatment, hemoptysis may worsen, creating a very poor quality of life and become life threatening.  He and his daughter Jocelyn Lamer were encouraged to ask questions that were answered to their stated satisfaction.  At the end of our conversation, the patient and his daughter, Vickie elect to proceed with the recommended course of palliative radiotherapy.  He has freely signed written consent to proceed and we have also obtained verbal consent from his daughter, Jocelyn Lamer given his dementia.  We will proceed with CT simulation/treatment planning today in anticipation of beginning his daily treatments in the very near future.   Vickie plans to return to [place] on Thursday or Friday with her daughter, so, they can visit the patient and she will plan to present on Friday for her father's 'under treatment visit' here in the radiation clinic.    Nicholos Johns, PA-C    Tyler Pita, MD  [facility] Direct Dial: [phone]  Fax: [phone] [email or web address]  Skype  [email or web address]   This document serves as a record of services personally performed by Tyler Pita, MD and Freeman Caldron, PA-C. It was created on their behalf  by Wilburn Mylar, a trained medical scribe. The creation of this record is based on the scribe's personal observations and the provider's statements to them. This document has been checked and approved by the attending provider.

## 2020-06-24 ENCOUNTER — Telehealth: Payer: Self-pay | Admitting: Radiation Oncology

## 2020-06-24 ENCOUNTER — Ambulatory Visit
Admission: RE | Admit: 2020-06-24 | Discharge: 2020-06-24 | Disposition: A | Discharge status: Home / Self Care | Payer: Medicare Other | Source: Ambulatory Visit | Attending: Radiation Oncology | Admitting: Radiation Oncology

## 2020-06-24 DIAGNOSIS — R918 Other nonspecific abnormal finding of lung field: Secondary | ICD-10-CM | POA: Diagnosis not present

## 2020-06-24 DIAGNOSIS — D381 Neoplasm of uncertain behavior of trachea, bronchus and lung: Secondary | ICD-10-CM | POA: Diagnosis not present

## 2020-06-24 DIAGNOSIS — R040 Epistaxis: Secondary | ICD-10-CM | POA: Diagnosis not present

## 2020-06-24 DIAGNOSIS — Z87891 Personal history of nicotine dependence: Secondary | ICD-10-CM | POA: Diagnosis not present

## 2020-06-24 NOTE — Telephone Encounter (Signed)
Phoned Orland Dec, Solicitor, at [employer]. Made arrangements with Orland Dec to have the patient present for radiation treatment tomorrow 06/24/20 at 1215.

## 2020-06-24 NOTE — Telephone Encounter (Signed)
Phoned Orland Dec, Solicitor, at [employer] to inform her of patient's upcoming treatment appointment so she could make arrangements. Orland Dec reports that today the patient return with a treatment calendar. We compared appointment dates and times. Orland Dec committed to making arrangements for all future radiation treatment appointments.

## 2020-06-25 ENCOUNTER — Ambulatory Visit
Admission: RE | Admit: 2020-06-25 | Discharge: 2020-06-25 | Disposition: A | Discharge status: Home / Self Care | Payer: Medicare Other | Source: Ambulatory Visit | Attending: Radiation Oncology | Admitting: Radiation Oncology

## 2020-06-25 DIAGNOSIS — D381 Neoplasm of uncertain behavior of trachea, bronchus and lung: Secondary | ICD-10-CM | POA: Diagnosis not present

## 2020-06-25 DIAGNOSIS — Z87891 Personal history of nicotine dependence: Secondary | ICD-10-CM | POA: Diagnosis not present

## 2020-06-25 DIAGNOSIS — R040 Epistaxis: Secondary | ICD-10-CM | POA: Diagnosis not present

## 2020-06-25 DIAGNOSIS — R918 Other nonspecific abnormal finding of lung field: Secondary | ICD-10-CM | POA: Diagnosis not present

## 2020-06-26 ENCOUNTER — Ambulatory Visit
Admission: RE | Admit: 2020-06-26 | Discharge: 2020-06-26 | Disposition: A | Discharge status: Home / Self Care | Payer: Medicare Other | Source: Ambulatory Visit | Attending: Radiation Oncology | Admitting: Radiation Oncology

## 2020-06-26 DIAGNOSIS — I2510 Atherosclerotic heart disease of native coronary artery without angina pectoris: Secondary | ICD-10-CM | POA: Diagnosis not present

## 2020-06-26 DIAGNOSIS — Z8673 Personal history of transient ischemic attack (TIA), and cerebral infarction without residual deficits: Secondary | ICD-10-CM | POA: Diagnosis not present

## 2020-06-26 DIAGNOSIS — M6281 Muscle weakness (generalized): Secondary | ICD-10-CM | POA: Diagnosis not present

## 2020-06-26 DIAGNOSIS — R040 Epistaxis: Secondary | ICD-10-CM | POA: Diagnosis not present

## 2020-06-26 DIAGNOSIS — E785 Hyperlipidemia, unspecified: Secondary | ICD-10-CM | POA: Diagnosis not present

## 2020-06-26 DIAGNOSIS — C3412 Malignant neoplasm of upper lobe, left bronchus or lung: Secondary | ICD-10-CM | POA: Diagnosis not present

## 2020-06-26 DIAGNOSIS — M6259 Muscle wasting and atrophy, not elsewhere classified, multiple sites: Secondary | ICD-10-CM | POA: Diagnosis not present

## 2020-06-26 DIAGNOSIS — R2681 Unsteadiness on feet: Secondary | ICD-10-CM | POA: Diagnosis not present

## 2020-06-26 DIAGNOSIS — Z87891 Personal history of nicotine dependence: Secondary | ICD-10-CM | POA: Diagnosis not present

## 2020-06-26 DIAGNOSIS — R2689 Other abnormalities of gait and mobility: Secondary | ICD-10-CM | POA: Diagnosis not present

## 2020-06-26 DIAGNOSIS — R1312 Dysphagia, oropharyngeal phase: Secondary | ICD-10-CM | POA: Diagnosis not present

## 2020-06-26 DIAGNOSIS — Q271 Congenital renal artery stenosis: Secondary | ICD-10-CM | POA: Diagnosis not present

## 2020-06-26 DIAGNOSIS — D381 Neoplasm of uncertain behavior of trachea, bronchus and lung: Secondary | ICD-10-CM | POA: Diagnosis not present

## 2020-06-26 DIAGNOSIS — N183 Chronic kidney disease, stage 3 unspecified: Secondary | ICD-10-CM | POA: Diagnosis not present

## 2020-06-26 DIAGNOSIS — R918 Other nonspecific abnormal finding of lung field: Secondary | ICD-10-CM | POA: Diagnosis not present

## 2020-06-26 DIAGNOSIS — I10 Essential (primary) hypertension: Secondary | ICD-10-CM | POA: Diagnosis not present

## 2020-06-26 DIAGNOSIS — Z8679 Personal history of other diseases of the circulatory system: Secondary | ICD-10-CM | POA: Diagnosis not present

## 2020-06-29 ENCOUNTER — Ambulatory Visit
Admission: RE | Admit: 2020-06-29 | Discharge: 2020-06-29 | Disposition: A | Discharge status: Home / Self Care | Payer: Medicare Other | Source: Ambulatory Visit | Attending: Radiation Oncology | Admitting: Radiation Oncology

## 2020-06-29 DIAGNOSIS — R2689 Other abnormalities of gait and mobility: Secondary | ICD-10-CM | POA: Diagnosis not present

## 2020-06-29 DIAGNOSIS — Z8679 Personal history of other diseases of the circulatory system: Secondary | ICD-10-CM | POA: Diagnosis not present

## 2020-06-29 DIAGNOSIS — R040 Epistaxis: Secondary | ICD-10-CM | POA: Diagnosis not present

## 2020-06-29 DIAGNOSIS — I10 Essential (primary) hypertension: Secondary | ICD-10-CM | POA: Diagnosis not present

## 2020-06-29 DIAGNOSIS — E785 Hyperlipidemia, unspecified: Secondary | ICD-10-CM | POA: Diagnosis not present

## 2020-06-29 DIAGNOSIS — M6281 Muscle weakness (generalized): Secondary | ICD-10-CM | POA: Diagnosis not present

## 2020-06-29 DIAGNOSIS — C3412 Malignant neoplasm of upper lobe, left bronchus or lung: Secondary | ICD-10-CM | POA: Diagnosis not present

## 2020-06-29 DIAGNOSIS — I2510 Atherosclerotic heart disease of native coronary artery without angina pectoris: Secondary | ICD-10-CM | POA: Diagnosis not present

## 2020-06-29 DIAGNOSIS — Z8673 Personal history of transient ischemic attack (TIA), and cerebral infarction without residual deficits: Secondary | ICD-10-CM | POA: Diagnosis not present

## 2020-06-29 DIAGNOSIS — D381 Neoplasm of uncertain behavior of trachea, bronchus and lung: Secondary | ICD-10-CM | POA: Diagnosis not present

## 2020-06-29 DIAGNOSIS — Z87891 Personal history of nicotine dependence: Secondary | ICD-10-CM | POA: Diagnosis not present

## 2020-06-29 DIAGNOSIS — R2681 Unsteadiness on feet: Secondary | ICD-10-CM | POA: Diagnosis not present

## 2020-06-29 DIAGNOSIS — Q271 Congenital renal artery stenosis: Secondary | ICD-10-CM | POA: Diagnosis not present

## 2020-06-29 DIAGNOSIS — N183 Chronic kidney disease, stage 3 unspecified: Secondary | ICD-10-CM | POA: Diagnosis not present

## 2020-06-29 DIAGNOSIS — M6259 Muscle wasting and atrophy, not elsewhere classified, multiple sites: Secondary | ICD-10-CM | POA: Diagnosis not present

## 2020-06-29 DIAGNOSIS — R1312 Dysphagia, oropharyngeal phase: Secondary | ICD-10-CM | POA: Diagnosis not present

## 2020-06-29 DIAGNOSIS — R918 Other nonspecific abnormal finding of lung field: Secondary | ICD-10-CM | POA: Diagnosis not present

## 2020-06-30 ENCOUNTER — Ambulatory Visit
Admission: RE | Admit: 2020-06-30 | Discharge: 2020-06-30 | Disposition: A | Discharge status: Home / Self Care | Payer: Medicare Other | Source: Ambulatory Visit | Attending: Radiation Oncology | Admitting: Radiation Oncology

## 2020-06-30 ENCOUNTER — Other Ambulatory Visit: Payer: Self-pay

## 2020-06-30 DIAGNOSIS — I10 Essential (primary) hypertension: Secondary | ICD-10-CM | POA: Diagnosis not present

## 2020-06-30 DIAGNOSIS — C3412 Malignant neoplasm of upper lobe, left bronchus or lung: Secondary | ICD-10-CM | POA: Diagnosis not present

## 2020-06-30 DIAGNOSIS — Z8673 Personal history of transient ischemic attack (TIA), and cerebral infarction without residual deficits: Secondary | ICD-10-CM | POA: Diagnosis not present

## 2020-06-30 DIAGNOSIS — D381 Neoplasm of uncertain behavior of trachea, bronchus and lung: Secondary | ICD-10-CM | POA: Diagnosis not present

## 2020-06-30 DIAGNOSIS — Q271 Congenital renal artery stenosis: Secondary | ICD-10-CM | POA: Diagnosis not present

## 2020-06-30 DIAGNOSIS — K0889 Other specified disorders of teeth and supporting structures: Secondary | ICD-10-CM | POA: Diagnosis not present

## 2020-06-30 DIAGNOSIS — M6259 Muscle wasting and atrophy, not elsewhere classified, multiple sites: Secondary | ICD-10-CM | POA: Diagnosis not present

## 2020-06-30 DIAGNOSIS — R1312 Dysphagia, oropharyngeal phase: Secondary | ICD-10-CM | POA: Diagnosis not present

## 2020-06-30 DIAGNOSIS — Z8679 Personal history of other diseases of the circulatory system: Secondary | ICD-10-CM | POA: Diagnosis not present

## 2020-06-30 DIAGNOSIS — R2689 Other abnormalities of gait and mobility: Secondary | ICD-10-CM | POA: Diagnosis not present

## 2020-06-30 DIAGNOSIS — Z87891 Personal history of nicotine dependence: Secondary | ICD-10-CM | POA: Diagnosis not present

## 2020-06-30 DIAGNOSIS — R918 Other nonspecific abnormal finding of lung field: Secondary | ICD-10-CM | POA: Diagnosis not present

## 2020-06-30 DIAGNOSIS — I2510 Atherosclerotic heart disease of native coronary artery without angina pectoris: Secondary | ICD-10-CM | POA: Diagnosis not present

## 2020-06-30 DIAGNOSIS — M6281 Muscle weakness (generalized): Secondary | ICD-10-CM | POA: Diagnosis not present

## 2020-06-30 DIAGNOSIS — N183 Chronic kidney disease, stage 3 unspecified: Secondary | ICD-10-CM | POA: Diagnosis not present

## 2020-06-30 DIAGNOSIS — R040 Epistaxis: Secondary | ICD-10-CM | POA: Diagnosis not present

## 2020-06-30 DIAGNOSIS — C3490 Malignant neoplasm of unspecified part of unspecified bronchus or lung: Secondary | ICD-10-CM | POA: Diagnosis not present

## 2020-06-30 DIAGNOSIS — R2681 Unsteadiness on feet: Secondary | ICD-10-CM | POA: Diagnosis not present

## 2020-06-30 DIAGNOSIS — E785 Hyperlipidemia, unspecified: Secondary | ICD-10-CM | POA: Diagnosis not present

## 2020-07-01 ENCOUNTER — Ambulatory Visit
Admission: RE | Admit: 2020-07-01 | Discharge: 2020-07-01 | Disposition: A | Discharge status: Home / Self Care | Payer: Medicare Other | Source: Ambulatory Visit | Attending: Radiation Oncology | Admitting: Radiation Oncology

## 2020-07-01 DIAGNOSIS — M6281 Muscle weakness (generalized): Secondary | ICD-10-CM | POA: Diagnosis not present

## 2020-07-01 DIAGNOSIS — D381 Neoplasm of uncertain behavior of trachea, bronchus and lung: Secondary | ICD-10-CM | POA: Diagnosis not present

## 2020-07-01 DIAGNOSIS — R2681 Unsteadiness on feet: Secondary | ICD-10-CM | POA: Diagnosis not present

## 2020-07-01 DIAGNOSIS — C3412 Malignant neoplasm of upper lobe, left bronchus or lung: Secondary | ICD-10-CM | POA: Diagnosis not present

## 2020-07-01 DIAGNOSIS — Z87891 Personal history of nicotine dependence: Secondary | ICD-10-CM | POA: Diagnosis not present

## 2020-07-01 DIAGNOSIS — M6259 Muscle wasting and atrophy, not elsewhere classified, multiple sites: Secondary | ICD-10-CM | POA: Diagnosis not present

## 2020-07-01 DIAGNOSIS — Z8679 Personal history of other diseases of the circulatory system: Secondary | ICD-10-CM | POA: Diagnosis not present

## 2020-07-01 DIAGNOSIS — I2510 Atherosclerotic heart disease of native coronary artery without angina pectoris: Secondary | ICD-10-CM | POA: Diagnosis not present

## 2020-07-01 DIAGNOSIS — R1312 Dysphagia, oropharyngeal phase: Secondary | ICD-10-CM | POA: Diagnosis not present

## 2020-07-01 DIAGNOSIS — R2689 Other abnormalities of gait and mobility: Secondary | ICD-10-CM | POA: Diagnosis not present

## 2020-07-01 DIAGNOSIS — R918 Other nonspecific abnormal finding of lung field: Secondary | ICD-10-CM | POA: Diagnosis not present

## 2020-07-01 DIAGNOSIS — N183 Chronic kidney disease, stage 3 unspecified: Secondary | ICD-10-CM | POA: Diagnosis not present

## 2020-07-01 DIAGNOSIS — E785 Hyperlipidemia, unspecified: Secondary | ICD-10-CM | POA: Diagnosis not present

## 2020-07-01 DIAGNOSIS — I10 Essential (primary) hypertension: Secondary | ICD-10-CM | POA: Diagnosis not present

## 2020-07-01 DIAGNOSIS — Z8673 Personal history of transient ischemic attack (TIA), and cerebral infarction without residual deficits: Secondary | ICD-10-CM | POA: Diagnosis not present

## 2020-07-01 DIAGNOSIS — R040 Epistaxis: Secondary | ICD-10-CM | POA: Diagnosis not present

## 2020-07-01 DIAGNOSIS — Q271 Congenital renal artery stenosis: Secondary | ICD-10-CM | POA: Diagnosis not present

## 2020-07-02 ENCOUNTER — Ambulatory Visit
Admission: RE | Admit: 2020-07-02 | Discharge: 2020-07-02 | Disposition: A | Discharge status: Home / Self Care | Payer: Medicare Other | Source: Ambulatory Visit | Attending: Radiation Oncology | Admitting: Radiation Oncology

## 2020-07-02 DIAGNOSIS — M6281 Muscle weakness (generalized): Secondary | ICD-10-CM | POA: Diagnosis not present

## 2020-07-02 DIAGNOSIS — I2510 Atherosclerotic heart disease of native coronary artery without angina pectoris: Secondary | ICD-10-CM | POA: Diagnosis not present

## 2020-07-02 DIAGNOSIS — N183 Chronic kidney disease, stage 3 unspecified: Secondary | ICD-10-CM | POA: Diagnosis not present

## 2020-07-02 DIAGNOSIS — I10 Essential (primary) hypertension: Secondary | ICD-10-CM | POA: Diagnosis not present

## 2020-07-02 DIAGNOSIS — Z87891 Personal history of nicotine dependence: Secondary | ICD-10-CM | POA: Diagnosis not present

## 2020-07-02 DIAGNOSIS — R1312 Dysphagia, oropharyngeal phase: Secondary | ICD-10-CM | POA: Diagnosis not present

## 2020-07-02 DIAGNOSIS — M6259 Muscle wasting and atrophy, not elsewhere classified, multiple sites: Secondary | ICD-10-CM | POA: Diagnosis not present

## 2020-07-02 DIAGNOSIS — C3412 Malignant neoplasm of upper lobe, left bronchus or lung: Secondary | ICD-10-CM | POA: Diagnosis not present

## 2020-07-02 DIAGNOSIS — R2689 Other abnormalities of gait and mobility: Secondary | ICD-10-CM | POA: Diagnosis not present

## 2020-07-02 DIAGNOSIS — R2681 Unsteadiness on feet: Secondary | ICD-10-CM | POA: Diagnosis not present

## 2020-07-02 DIAGNOSIS — D381 Neoplasm of uncertain behavior of trachea, bronchus and lung: Secondary | ICD-10-CM | POA: Diagnosis not present

## 2020-07-02 DIAGNOSIS — Z8673 Personal history of transient ischemic attack (TIA), and cerebral infarction without residual deficits: Secondary | ICD-10-CM | POA: Diagnosis not present

## 2020-07-02 DIAGNOSIS — Z8679 Personal history of other diseases of the circulatory system: Secondary | ICD-10-CM | POA: Diagnosis not present

## 2020-07-02 DIAGNOSIS — Q271 Congenital renal artery stenosis: Secondary | ICD-10-CM | POA: Diagnosis not present

## 2020-07-02 DIAGNOSIS — E785 Hyperlipidemia, unspecified: Secondary | ICD-10-CM | POA: Diagnosis not present

## 2020-07-02 DIAGNOSIS — R040 Epistaxis: Secondary | ICD-10-CM | POA: Diagnosis not present

## 2020-07-02 DIAGNOSIS — R918 Other nonspecific abnormal finding of lung field: Secondary | ICD-10-CM | POA: Diagnosis not present

## 2020-07-03 ENCOUNTER — Ambulatory Visit
Admission: RE | Admit: 2020-07-03 | Discharge: 2020-07-03 | Disposition: A | Discharge status: Home / Self Care | Payer: Medicare Other | Source: Ambulatory Visit | Attending: Radiation Oncology | Admitting: Radiation Oncology

## 2020-07-03 ENCOUNTER — Other Ambulatory Visit: Payer: Self-pay

## 2020-07-03 DIAGNOSIS — Z8673 Personal history of transient ischemic attack (TIA), and cerebral infarction without residual deficits: Secondary | ICD-10-CM | POA: Diagnosis not present

## 2020-07-03 DIAGNOSIS — M6259 Muscle wasting and atrophy, not elsewhere classified, multiple sites: Secondary | ICD-10-CM | POA: Diagnosis not present

## 2020-07-03 DIAGNOSIS — R1312 Dysphagia, oropharyngeal phase: Secondary | ICD-10-CM | POA: Diagnosis not present

## 2020-07-03 DIAGNOSIS — R2689 Other abnormalities of gait and mobility: Secondary | ICD-10-CM | POA: Diagnosis not present

## 2020-07-03 DIAGNOSIS — R2681 Unsteadiness on feet: Secondary | ICD-10-CM | POA: Diagnosis not present

## 2020-07-03 DIAGNOSIS — Z8679 Personal history of other diseases of the circulatory system: Secondary | ICD-10-CM | POA: Diagnosis not present

## 2020-07-03 DIAGNOSIS — D381 Neoplasm of uncertain behavior of trachea, bronchus and lung: Secondary | ICD-10-CM | POA: Diagnosis not present

## 2020-07-03 DIAGNOSIS — R040 Epistaxis: Secondary | ICD-10-CM | POA: Diagnosis not present

## 2020-07-03 DIAGNOSIS — Z87891 Personal history of nicotine dependence: Secondary | ICD-10-CM | POA: Diagnosis not present

## 2020-07-03 DIAGNOSIS — E785 Hyperlipidemia, unspecified: Secondary | ICD-10-CM | POA: Diagnosis not present

## 2020-07-03 DIAGNOSIS — M6281 Muscle weakness (generalized): Secondary | ICD-10-CM | POA: Diagnosis not present

## 2020-07-03 DIAGNOSIS — N183 Chronic kidney disease, stage 3 unspecified: Secondary | ICD-10-CM | POA: Diagnosis not present

## 2020-07-03 DIAGNOSIS — I2510 Atherosclerotic heart disease of native coronary artery without angina pectoris: Secondary | ICD-10-CM | POA: Diagnosis not present

## 2020-07-03 DIAGNOSIS — R918 Other nonspecific abnormal finding of lung field: Secondary | ICD-10-CM | POA: Diagnosis not present

## 2020-07-03 DIAGNOSIS — I10 Essential (primary) hypertension: Secondary | ICD-10-CM | POA: Diagnosis not present

## 2020-07-03 DIAGNOSIS — Q271 Congenital renal artery stenosis: Secondary | ICD-10-CM | POA: Diagnosis not present

## 2020-07-03 DIAGNOSIS — C3412 Malignant neoplasm of upper lobe, left bronchus or lung: Secondary | ICD-10-CM | POA: Diagnosis not present

## 2020-07-06 ENCOUNTER — Ambulatory Visit
Admission: RE | Admit: 2020-07-06 | Discharge: 2020-07-06 | Disposition: A | Discharge status: Home / Self Care | Payer: Medicare Other | Source: Ambulatory Visit | Attending: Radiation Oncology | Admitting: Radiation Oncology

## 2020-07-06 DIAGNOSIS — R918 Other nonspecific abnormal finding of lung field: Secondary | ICD-10-CM | POA: Diagnosis not present

## 2020-07-06 DIAGNOSIS — C3412 Malignant neoplasm of upper lobe, left bronchus or lung: Secondary | ICD-10-CM | POA: Diagnosis not present

## 2020-07-06 DIAGNOSIS — D381 Neoplasm of uncertain behavior of trachea, bronchus and lung: Secondary | ICD-10-CM | POA: Diagnosis not present

## 2020-07-06 DIAGNOSIS — Z87891 Personal history of nicotine dependence: Secondary | ICD-10-CM | POA: Diagnosis not present

## 2020-07-06 DIAGNOSIS — R2681 Unsteadiness on feet: Secondary | ICD-10-CM | POA: Diagnosis not present

## 2020-07-06 DIAGNOSIS — E785 Hyperlipidemia, unspecified: Secondary | ICD-10-CM | POA: Diagnosis not present

## 2020-07-06 DIAGNOSIS — Z8673 Personal history of transient ischemic attack (TIA), and cerebral infarction without residual deficits: Secondary | ICD-10-CM | POA: Diagnosis not present

## 2020-07-06 DIAGNOSIS — R1312 Dysphagia, oropharyngeal phase: Secondary | ICD-10-CM | POA: Diagnosis not present

## 2020-07-06 DIAGNOSIS — I10 Essential (primary) hypertension: Secondary | ICD-10-CM | POA: Diagnosis not present

## 2020-07-06 DIAGNOSIS — Z8679 Personal history of other diseases of the circulatory system: Secondary | ICD-10-CM | POA: Diagnosis not present

## 2020-07-06 DIAGNOSIS — R040 Epistaxis: Secondary | ICD-10-CM | POA: Diagnosis not present

## 2020-07-06 DIAGNOSIS — M6281 Muscle weakness (generalized): Secondary | ICD-10-CM | POA: Diagnosis not present

## 2020-07-06 DIAGNOSIS — I2510 Atherosclerotic heart disease of native coronary artery without angina pectoris: Secondary | ICD-10-CM | POA: Diagnosis not present

## 2020-07-06 DIAGNOSIS — Q271 Congenital renal artery stenosis: Secondary | ICD-10-CM | POA: Diagnosis not present

## 2020-07-06 DIAGNOSIS — N183 Chronic kidney disease, stage 3 unspecified: Secondary | ICD-10-CM | POA: Diagnosis not present

## 2020-07-06 DIAGNOSIS — R2689 Other abnormalities of gait and mobility: Secondary | ICD-10-CM | POA: Diagnosis not present

## 2020-07-06 DIAGNOSIS — M6259 Muscle wasting and atrophy, not elsewhere classified, multiple sites: Secondary | ICD-10-CM | POA: Diagnosis not present

## 2020-07-07 ENCOUNTER — Encounter: Payer: Self-pay | Admitting: Radiation Oncology

## 2020-07-07 ENCOUNTER — Other Ambulatory Visit: Payer: Self-pay

## 2020-07-07 ENCOUNTER — Ambulatory Visit
Admission: RE | Admit: 2020-07-07 | Discharge: 2020-07-07 | Disposition: A | Discharge status: Home / Self Care | Payer: Medicare Other | Source: Ambulatory Visit | Attending: Radiation Oncology | Admitting: Radiation Oncology

## 2020-07-07 DIAGNOSIS — D381 Neoplasm of uncertain behavior of trachea, bronchus and lung: Secondary | ICD-10-CM | POA: Diagnosis not present

## 2020-07-07 DIAGNOSIS — I2510 Atherosclerotic heart disease of native coronary artery without angina pectoris: Secondary | ICD-10-CM | POA: Diagnosis not present

## 2020-07-07 DIAGNOSIS — Z8673 Personal history of transient ischemic attack (TIA), and cerebral infarction without residual deficits: Secondary | ICD-10-CM | POA: Diagnosis not present

## 2020-07-07 DIAGNOSIS — Z8679 Personal history of other diseases of the circulatory system: Secondary | ICD-10-CM | POA: Diagnosis not present

## 2020-07-07 DIAGNOSIS — N183 Chronic kidney disease, stage 3 unspecified: Secondary | ICD-10-CM | POA: Diagnosis not present

## 2020-07-07 DIAGNOSIS — R918 Other nonspecific abnormal finding of lung field: Secondary | ICD-10-CM | POA: Diagnosis not present

## 2020-07-07 DIAGNOSIS — C3412 Malignant neoplasm of upper lobe, left bronchus or lung: Secondary | ICD-10-CM | POA: Diagnosis not present

## 2020-07-07 DIAGNOSIS — I10 Essential (primary) hypertension: Secondary | ICD-10-CM | POA: Diagnosis not present

## 2020-07-07 DIAGNOSIS — M6281 Muscle weakness (generalized): Secondary | ICD-10-CM | POA: Diagnosis not present

## 2020-07-07 DIAGNOSIS — R2689 Other abnormalities of gait and mobility: Secondary | ICD-10-CM | POA: Diagnosis not present

## 2020-07-07 DIAGNOSIS — Q271 Congenital renal artery stenosis: Secondary | ICD-10-CM | POA: Diagnosis not present

## 2020-07-07 DIAGNOSIS — R1312 Dysphagia, oropharyngeal phase: Secondary | ICD-10-CM | POA: Diagnosis not present

## 2020-07-07 DIAGNOSIS — R040 Epistaxis: Secondary | ICD-10-CM | POA: Diagnosis not present

## 2020-07-07 DIAGNOSIS — E785 Hyperlipidemia, unspecified: Secondary | ICD-10-CM | POA: Diagnosis not present

## 2020-07-07 DIAGNOSIS — M6259 Muscle wasting and atrophy, not elsewhere classified, multiple sites: Secondary | ICD-10-CM | POA: Diagnosis not present

## 2020-07-07 DIAGNOSIS — Z87891 Personal history of nicotine dependence: Secondary | ICD-10-CM | POA: Diagnosis not present

## 2020-07-07 DIAGNOSIS — R2681 Unsteadiness on feet: Secondary | ICD-10-CM | POA: Diagnosis not present

## 2020-07-08 DIAGNOSIS — Z8673 Personal history of transient ischemic attack (TIA), and cerebral infarction without residual deficits: Secondary | ICD-10-CM | POA: Diagnosis not present

## 2020-07-08 DIAGNOSIS — Z8679 Personal history of other diseases of the circulatory system: Secondary | ICD-10-CM | POA: Diagnosis not present

## 2020-07-08 DIAGNOSIS — Q271 Congenital renal artery stenosis: Secondary | ICD-10-CM | POA: Diagnosis not present

## 2020-07-08 DIAGNOSIS — M6281 Muscle weakness (generalized): Secondary | ICD-10-CM | POA: Diagnosis not present

## 2020-07-08 DIAGNOSIS — R1312 Dysphagia, oropharyngeal phase: Secondary | ICD-10-CM | POA: Diagnosis not present

## 2020-07-08 DIAGNOSIS — R2689 Other abnormalities of gait and mobility: Secondary | ICD-10-CM | POA: Diagnosis not present

## 2020-07-08 DIAGNOSIS — N183 Chronic kidney disease, stage 3 unspecified: Secondary | ICD-10-CM | POA: Diagnosis not present

## 2020-07-08 DIAGNOSIS — I10 Essential (primary) hypertension: Secondary | ICD-10-CM | POA: Diagnosis not present

## 2020-07-08 DIAGNOSIS — C3412 Malignant neoplasm of upper lobe, left bronchus or lung: Secondary | ICD-10-CM | POA: Diagnosis not present

## 2020-07-08 DIAGNOSIS — M6259 Muscle wasting and atrophy, not elsewhere classified, multiple sites: Secondary | ICD-10-CM | POA: Diagnosis not present

## 2020-07-08 DIAGNOSIS — I2510 Atherosclerotic heart disease of native coronary artery without angina pectoris: Secondary | ICD-10-CM | POA: Diagnosis not present

## 2020-07-08 DIAGNOSIS — E785 Hyperlipidemia, unspecified: Secondary | ICD-10-CM | POA: Diagnosis not present

## 2020-07-08 DIAGNOSIS — R2681 Unsteadiness on feet: Secondary | ICD-10-CM | POA: Diagnosis not present

## 2020-07-09 DIAGNOSIS — Z8673 Personal history of transient ischemic attack (TIA), and cerebral infarction without residual deficits: Secondary | ICD-10-CM | POA: Diagnosis not present

## 2020-07-09 DIAGNOSIS — E785 Hyperlipidemia, unspecified: Secondary | ICD-10-CM | POA: Diagnosis not present

## 2020-07-09 DIAGNOSIS — C3412 Malignant neoplasm of upper lobe, left bronchus or lung: Secondary | ICD-10-CM | POA: Diagnosis not present

## 2020-07-09 DIAGNOSIS — I10 Essential (primary) hypertension: Secondary | ICD-10-CM | POA: Diagnosis not present

## 2020-07-09 DIAGNOSIS — I2510 Atherosclerotic heart disease of native coronary artery without angina pectoris: Secondary | ICD-10-CM | POA: Diagnosis not present

## 2020-07-09 DIAGNOSIS — R1312 Dysphagia, oropharyngeal phase: Secondary | ICD-10-CM | POA: Diagnosis not present

## 2020-07-09 DIAGNOSIS — R2689 Other abnormalities of gait and mobility: Secondary | ICD-10-CM | POA: Diagnosis not present

## 2020-07-09 DIAGNOSIS — N183 Chronic kidney disease, stage 3 unspecified: Secondary | ICD-10-CM | POA: Diagnosis not present

## 2020-07-09 DIAGNOSIS — M6281 Muscle weakness (generalized): Secondary | ICD-10-CM | POA: Diagnosis not present

## 2020-07-09 DIAGNOSIS — Q271 Congenital renal artery stenosis: Secondary | ICD-10-CM | POA: Diagnosis not present

## 2020-07-09 DIAGNOSIS — Z8679 Personal history of other diseases of the circulatory system: Secondary | ICD-10-CM | POA: Diagnosis not present

## 2020-07-09 DIAGNOSIS — R2681 Unsteadiness on feet: Secondary | ICD-10-CM | POA: Diagnosis not present

## 2020-07-09 DIAGNOSIS — M6259 Muscle wasting and atrophy, not elsewhere classified, multiple sites: Secondary | ICD-10-CM | POA: Diagnosis not present

## 2020-07-10 DIAGNOSIS — M6259 Muscle wasting and atrophy, not elsewhere classified, multiple sites: Secondary | ICD-10-CM | POA: Diagnosis not present

## 2020-07-10 DIAGNOSIS — R2689 Other abnormalities of gait and mobility: Secondary | ICD-10-CM | POA: Diagnosis not present

## 2020-07-10 DIAGNOSIS — R2681 Unsteadiness on feet: Secondary | ICD-10-CM | POA: Diagnosis not present

## 2020-07-10 DIAGNOSIS — C3412 Malignant neoplasm of upper lobe, left bronchus or lung: Secondary | ICD-10-CM | POA: Diagnosis not present

## 2020-07-10 DIAGNOSIS — Q271 Congenital renal artery stenosis: Secondary | ICD-10-CM | POA: Diagnosis not present

## 2020-07-10 DIAGNOSIS — Z8673 Personal history of transient ischemic attack (TIA), and cerebral infarction without residual deficits: Secondary | ICD-10-CM | POA: Diagnosis not present

## 2020-07-10 DIAGNOSIS — Z8679 Personal history of other diseases of the circulatory system: Secondary | ICD-10-CM | POA: Diagnosis not present

## 2020-07-10 DIAGNOSIS — N183 Chronic kidney disease, stage 3 unspecified: Secondary | ICD-10-CM | POA: Diagnosis not present

## 2020-07-10 DIAGNOSIS — I2510 Atherosclerotic heart disease of native coronary artery without angina pectoris: Secondary | ICD-10-CM | POA: Diagnosis not present

## 2020-07-10 DIAGNOSIS — M6281 Muscle weakness (generalized): Secondary | ICD-10-CM | POA: Diagnosis not present

## 2020-07-10 DIAGNOSIS — E785 Hyperlipidemia, unspecified: Secondary | ICD-10-CM | POA: Diagnosis not present

## 2020-07-10 DIAGNOSIS — I10 Essential (primary) hypertension: Secondary | ICD-10-CM | POA: Diagnosis not present

## 2020-07-10 DIAGNOSIS — R1312 Dysphagia, oropharyngeal phase: Secondary | ICD-10-CM | POA: Diagnosis not present

## 2020-07-13 DIAGNOSIS — Z8673 Personal history of transient ischemic attack (TIA), and cerebral infarction without residual deficits: Secondary | ICD-10-CM | POA: Diagnosis not present

## 2020-07-13 DIAGNOSIS — Z8679 Personal history of other diseases of the circulatory system: Secondary | ICD-10-CM | POA: Diagnosis not present

## 2020-07-13 DIAGNOSIS — R2681 Unsteadiness on feet: Secondary | ICD-10-CM | POA: Diagnosis not present

## 2020-07-13 DIAGNOSIS — E785 Hyperlipidemia, unspecified: Secondary | ICD-10-CM | POA: Diagnosis not present

## 2020-07-13 DIAGNOSIS — N183 Chronic kidney disease, stage 3 unspecified: Secondary | ICD-10-CM | POA: Diagnosis not present

## 2020-07-13 DIAGNOSIS — M6281 Muscle weakness (generalized): Secondary | ICD-10-CM | POA: Diagnosis not present

## 2020-07-13 DIAGNOSIS — Q271 Congenital renal artery stenosis: Secondary | ICD-10-CM | POA: Diagnosis not present

## 2020-07-13 DIAGNOSIS — C3412 Malignant neoplasm of upper lobe, left bronchus or lung: Secondary | ICD-10-CM | POA: Diagnosis not present

## 2020-07-13 DIAGNOSIS — C3490 Malignant neoplasm of unspecified part of unspecified bronchus or lung: Secondary | ICD-10-CM | POA: Diagnosis not present

## 2020-07-13 DIAGNOSIS — M6259 Muscle wasting and atrophy, not elsewhere classified, multiple sites: Secondary | ICD-10-CM | POA: Diagnosis not present

## 2020-07-13 DIAGNOSIS — R1312 Dysphagia, oropharyngeal phase: Secondary | ICD-10-CM | POA: Diagnosis not present

## 2020-07-13 DIAGNOSIS — I2510 Atherosclerotic heart disease of native coronary artery without angina pectoris: Secondary | ICD-10-CM | POA: Diagnosis not present

## 2020-07-13 DIAGNOSIS — L308 Other specified dermatitis: Secondary | ICD-10-CM | POA: Diagnosis not present

## 2020-07-13 DIAGNOSIS — I10 Essential (primary) hypertension: Secondary | ICD-10-CM | POA: Diagnosis not present

## 2020-07-13 DIAGNOSIS — R2689 Other abnormalities of gait and mobility: Secondary | ICD-10-CM | POA: Diagnosis not present

## 2020-07-13 NOTE — Progress Notes (Signed)
  Radiation Oncology         [phone]) [phone] ________________________________  Name: Henry Allen MRN: [id number]  Date: 07/07/2020  DOB: 1940-06-19  End of Treatment Note  Diagnosis:   80 year old male with new onset hemoptysis secondary to a 7 cm left hilar mass invading into the left mainstem bronchus, suspected primary malignancy.     Indication for treatment:  Palliation       Radiation treatment dates:   06/24/20-07/07/20  Site/dose:   The left hilar lung mass was treated to 30 Gy in 10 fractions of 3 Gy  Beams/energy:   3D conformal radiotherapy was delivered from 3 static gantry angles of zero, 95.3 and 180 degrees with a combination of 6 and 10 MV X-rays  Narrative: The patient tolerated radiation treatment relatively well.   His hemoptysis improved.  Plan: The patient has completed radiation treatment. The patient will return to radiation oncology clinic for routine followup in one month. I advised him to call or return sooner if he has any questions or concerns related to his recovery or treatment. ________________________________  Sheral Apley. Tammi Klippel, M.D.

## 2020-07-14 ENCOUNTER — Non-Acute Institutional Stay: Payer: Medicare Other | Admitting: Student

## 2020-07-14 ENCOUNTER — Other Ambulatory Visit: Payer: Self-pay

## 2020-07-14 DIAGNOSIS — R1312 Dysphagia, oropharyngeal phase: Secondary | ICD-10-CM | POA: Diagnosis not present

## 2020-07-14 DIAGNOSIS — M6281 Muscle weakness (generalized): Secondary | ICD-10-CM | POA: Diagnosis not present

## 2020-07-14 DIAGNOSIS — I2510 Atherosclerotic heart disease of native coronary artery without angina pectoris: Secondary | ICD-10-CM | POA: Diagnosis not present

## 2020-07-14 DIAGNOSIS — C3412 Malignant neoplasm of upper lobe, left bronchus or lung: Secondary | ICD-10-CM | POA: Diagnosis not present

## 2020-07-14 DIAGNOSIS — J439 Emphysema, unspecified: Secondary | ICD-10-CM | POA: Diagnosis not present

## 2020-07-14 DIAGNOSIS — R2689 Other abnormalities of gait and mobility: Secondary | ICD-10-CM | POA: Diagnosis not present

## 2020-07-14 DIAGNOSIS — Z515 Encounter for palliative care: Secondary | ICD-10-CM

## 2020-07-14 DIAGNOSIS — I150 Renovascular hypertension: Secondary | ICD-10-CM | POA: Diagnosis not present

## 2020-07-14 DIAGNOSIS — Z8673 Personal history of transient ischemic attack (TIA), and cerebral infarction without residual deficits: Secondary | ICD-10-CM | POA: Diagnosis not present

## 2020-07-14 DIAGNOSIS — R2681 Unsteadiness on feet: Secondary | ICD-10-CM | POA: Diagnosis not present

## 2020-07-14 DIAGNOSIS — N183 Chronic kidney disease, stage 3 unspecified: Secondary | ICD-10-CM | POA: Diagnosis not present

## 2020-07-14 DIAGNOSIS — C3490 Malignant neoplasm of unspecified part of unspecified bronchus or lung: Secondary | ICD-10-CM | POA: Diagnosis not present

## 2020-07-14 DIAGNOSIS — Q271 Congenital renal artery stenosis: Secondary | ICD-10-CM | POA: Diagnosis not present

## 2020-07-14 DIAGNOSIS — M6259 Muscle wasting and atrophy, not elsewhere classified, multiple sites: Secondary | ICD-10-CM | POA: Diagnosis not present

## 2020-07-14 DIAGNOSIS — I10 Essential (primary) hypertension: Secondary | ICD-10-CM | POA: Diagnosis not present

## 2020-07-14 DIAGNOSIS — E785 Hyperlipidemia, unspecified: Secondary | ICD-10-CM | POA: Diagnosis not present

## 2020-07-14 DIAGNOSIS — Z8679 Personal history of other diseases of the circulatory system: Secondary | ICD-10-CM | POA: Diagnosis not present

## 2020-07-14 NOTE — Progress Notes (Signed)
[facility] Consult Note Telephone: [phone]  Fax: [phone]  PATIENT NAME: Henry Allen [place] [id number] [phone] (home)  DOB: 1940-10-05 MRN: [id number]  PRIMARY CARE PROVIDER:  Dr. Jules Husbands     REFERRING PROVIDER:   Dr. Jules Husbands  RESPONSIBLE PARTY:   Extended Emergency Contact Information Primary Emergency Contact: Henry Allen Address: [address]          [place], [place] [address] [place] Phone: [phone] Relation: None Secondary Emergency Contact: Kenn File Home Phone: [phone] Mobile Phone: [phone] Relation: Daughter  I met face to face with patient in facility.   ASSESSMENT AND RECOMMENDATIONS:   Advance Care Planning: Visit at the request of Dr. Keenan Bachelor for palliative consult. Visit consisted of building trust and discussions on Palliative care medicine as specialized medical care for people living with serious illness, aimed at facilitating improved quality of life through symptoms relief, assisting with advance care planning and establishing goals of care. Palliative care will continue to provide support to patient, family and the medical team.  Goal of care: To complete radiation, maintain current level of care.   Directives: MOST form on file; reviewed. DNR, Limited interventions, antibiotics, IV fluids as indicated, no feeding tube  Symptom Management:   Shortness of breath-patient with shortness of breath, mostly with exertion. Lung cancer. Continue oxygen 2lpm prn to keep sats 92% or >, breo ellipta 200-16mg 1 puff daily, combivent respimat 20-1019m 1 puff BID, albuterol Inhaler 2 puffs every 6 hours PRN wheezing/shortness of breath. Staff to monitor for cough, hemoptysis.  Facial erythema-facial erythema s/p radiation. New orders on 07/13/2020 to wash face with mild soap daily, apply hydrocortisone 1% BID to areas of  dryness on forehead, cheeks and nose BID x 7 days.   Lung Cancer-patient completed radiation therapy 07/07/2020. He is to follow up with Oncology in March 2022. Staff to monitor for cough, hemoptysis, shortness of breath.   Follow up Palliative Care Visit: Palliative care will continue to follow for complex decision making and symptom management. Return in 4 weeks or prn.  Family /Caregiver/Community Supports: Palliative Medicine to continue to provide support to patient and family.   I spent 35 minutes providing this consultation, from 12:10 to 12:45pm. Time includes time spent with patient/family, chart review, provider coordination, and documentation. More than 50% of the time in this consultation was spent counseling and coordinating communication.   CHIEF COMPLAINT: Palliative Medicine initial consult  History obtained from review of EMR, discussion with primary team and patient. Records reviewed and summarized below.  HISTORY OF PRESENT ILLNESS:  Henry WAYSONs a [age].o. year old male with multiple medical problems including HTN, HLD, CAD s/p CABG, renal artery stenosis, stroke, vascular dementia, history of smoking and abdominal aortic aneurysm. Patient hospitalized 1/28-1/29/2022 for hemoptysis and Lung cancer. Patient noted to have a 7cm left hilar mass invading into the left mainstem bronchus, suspected primary malignancy. Patient received radiation treatments 2/2-2/15/2022. Palliative Care was asked to follow this patient by consultation request of Dr. BlKeenan Bacheloro help address advance care planning and goals of care. This is an initial visit.   Patient requires assistance with adls, previously independent. Endorses shortness of breath with exertion. Wears oxygen prn. Currently on room air. No hemoptysis, no chest pain. No LE edema. Uses w/c for locomotion long distances. Ambulatory without assistive device. Patient to follow up with Oncology in March, 2022.  CODE STATUS: DNR  PPS:  60%  HOSPICE ELIGIBILITY/DIAGNOSIS: TBD  ROS:   General: NAD, denies pain EYES: denies vision changes, wears glasses ENMT: denies dysphagia Cardiovascular: denies chest pain Pulmonary: occasional cough,  increased SOB with exertion Abdomen: endorses fair appetite, continence of bowel GU: denies dysuria MSK: ambulatory Skin: reports facial redness Neurological: endorses weakness, denies insomnia Psych: Endorses good mood Heme/lymph/immuno: denies bruises, abnormal bleeding   Physical Exam: Pulse 72, resp 16, 120/70, 97% on RA Weight: 168.8 pounds yesterday. Constitutional: NAD, alert and oriented x 2 General: frail appearing EYES: anicteric sclera, lids intact, no discharge  ENMT: intact hearing,oral mucous membranes moist CV: RRR,  no LE edema Pulmonary: Rhonchi on left, otherwise clear, no increased work of breathing, no cough, no audible wheezes, room air Abdomen: Bowel sounds normoactive x 4 GU: deferred MSK: ambulatory Skin: warm and dry, erythema to forehead; no warmth Neuro: Generalized weakness, s Psych: non anxious, pleasant affect Hem/lymph/immuno: no widespread bruising   PAST MEDICAL HISTORY:  Past Medical History:  Diagnosis Date  . Abdominal aortic aneurysm ([place]) 05/06/2008   7cm Infrarenal abdominal aortic aneurysm and bilateral common iliac aneurysms using a 20x10 bifurcated graft  . CAD (coronary artery disease) 2009   CABG   . Elevated PSA   . Hemorrhagic stroke ([place]) 1999  . Hyperlipemia   . Hypertension   . Renal artery stenosis (HCC)    he has minor atherosclerosis in the right renal artery but a 60-99% stenosis in the left renal artery.  . Stroke ([place])   . Vascular dementia ([place])   . Ventral hernia     SOCIAL HX:  Social History   Tobacco Use  . Smoking status: Former Smoker    Packs/day: 1.00    Types: Cigarettes  . Smokeless tobacco: Never Used  Substance Use Topics  . Alcohol use: Not on file   FAMILY HX:  Family History   Problem Relation Age of Onset  . Heart attack Mother 53  . Heart attack Father 24  . Heart attack Brother        x2 bother at age 32    ALLERGIES:  Allergies  Allergen Reactions  . Morphine Other (See Comments)    Hallucinations- listed as an "allergy" on the [facility]       PERTINENT MEDICATIONS:  Outpatient Encounter Medications as of 07/14/2020  Medication Sig  . acetaminophen (TYLENOL) 500 MG tablet Take 1,000 mg by mouth 2 (two) times daily.  Marland Kitchen albuterol (VENTOLIN HFA) 108 (90 Base) MCG/ACT inhaler Inhale 2 puffs into the lungs every 6 (six) hours as needed for wheezing or shortness of breath.  Marland Kitchen amLODipine (NORVASC) 10 MG tablet Take 10 mg by mouth daily.  . benzonatate (TESSALON) 200 MG capsule Take 1 capsule (200 mg total) by mouth 3 (three) times daily.  . Calcium Carb-Cholecalciferol (OYSTER CALCIUM/D3) 500-200 MG-UNIT TABS Take 1 tablet by mouth daily.  Marland Kitchen escitalopram (LEXAPRO) 10 MG tablet Take 10 mg by mouth daily.  . Flaxseed, Linseed, (FLAXSEED OIL) 1000 MG CAPS Take 1,000 mg by mouth in the morning.  . fluticasone furoate-vilanterol (BREO ELLIPTA) 200-25 MCG/INH AEPB Inhale 1 puff into the lungs daily.  Marland Kitchen guaiFENesin-dextromethorphan (ROBITUSSIN DM) 100-10 MG/5ML syrup Take 10 mLs by mouth every 4 (four) hours as needed for cough.  Marland Kitchen lisinopril-hydrochlorothiazide (ZESTORETIC) 20-12.5 MG tablet Take 2 tablets by mouth daily.  . metoprolol tartrate (LOPRESSOR) 25 MG tablet Take 25 mg by mouth 2 (two) times daily.  Marland Kitchen omeprazole (PRILOSEC) 20 MG capsule Take 1 capsule (20  mg total) by mouth daily. (Patient taking differently: Take 20 mg by mouth daily before breakfast.)  . Skin Protectants, Misc. (EUCERIN) cream Apply 1 application topically See admin instructions. Apply to both legs (lower halves) daily  . zinc sulfate 220 (50 Zn) MG capsule Take 1 capsule (220 mg total) by mouth daily. (Patient not taking: Reported on 06/19/2020)   No facility-administered encounter  medications on file as of 07/14/2020.     Thank you for the opportunity to participate in the care of Mr. Henshaw. The palliative care team will continue to follow. Please call our office at [phone] if we can be of additional assistance.  Ezekiel Slocumb, NP , AGPCNP-C

## 2020-07-15 DIAGNOSIS — R2681 Unsteadiness on feet: Secondary | ICD-10-CM | POA: Diagnosis not present

## 2020-07-15 DIAGNOSIS — R1312 Dysphagia, oropharyngeal phase: Secondary | ICD-10-CM | POA: Diagnosis not present

## 2020-07-15 DIAGNOSIS — M6259 Muscle wasting and atrophy, not elsewhere classified, multiple sites: Secondary | ICD-10-CM | POA: Diagnosis not present

## 2020-07-15 DIAGNOSIS — Z8673 Personal history of transient ischemic attack (TIA), and cerebral infarction without residual deficits: Secondary | ICD-10-CM | POA: Diagnosis not present

## 2020-07-15 DIAGNOSIS — E785 Hyperlipidemia, unspecified: Secondary | ICD-10-CM | POA: Diagnosis not present

## 2020-07-15 DIAGNOSIS — I10 Essential (primary) hypertension: Secondary | ICD-10-CM | POA: Diagnosis not present

## 2020-07-15 DIAGNOSIS — Z8679 Personal history of other diseases of the circulatory system: Secondary | ICD-10-CM | POA: Diagnosis not present

## 2020-07-15 DIAGNOSIS — I2510 Atherosclerotic heart disease of native coronary artery without angina pectoris: Secondary | ICD-10-CM | POA: Diagnosis not present

## 2020-07-15 DIAGNOSIS — N183 Chronic kidney disease, stage 3 unspecified: Secondary | ICD-10-CM | POA: Diagnosis not present

## 2020-07-15 DIAGNOSIS — R2689 Other abnormalities of gait and mobility: Secondary | ICD-10-CM | POA: Diagnosis not present

## 2020-07-15 DIAGNOSIS — M6281 Muscle weakness (generalized): Secondary | ICD-10-CM | POA: Diagnosis not present

## 2020-07-15 DIAGNOSIS — Q271 Congenital renal artery stenosis: Secondary | ICD-10-CM | POA: Diagnosis not present

## 2020-07-15 DIAGNOSIS — C3412 Malignant neoplasm of upper lobe, left bronchus or lung: Secondary | ICD-10-CM | POA: Diagnosis not present

## 2020-07-16 DIAGNOSIS — C3412 Malignant neoplasm of upper lobe, left bronchus or lung: Secondary | ICD-10-CM | POA: Diagnosis not present

## 2020-07-16 DIAGNOSIS — I10 Essential (primary) hypertension: Secondary | ICD-10-CM | POA: Diagnosis not present

## 2020-07-16 DIAGNOSIS — I2510 Atherosclerotic heart disease of native coronary artery without angina pectoris: Secondary | ICD-10-CM | POA: Diagnosis not present

## 2020-07-16 DIAGNOSIS — R2689 Other abnormalities of gait and mobility: Secondary | ICD-10-CM | POA: Diagnosis not present

## 2020-07-16 DIAGNOSIS — R1312 Dysphagia, oropharyngeal phase: Secondary | ICD-10-CM | POA: Diagnosis not present

## 2020-07-16 DIAGNOSIS — Z8679 Personal history of other diseases of the circulatory system: Secondary | ICD-10-CM | POA: Diagnosis not present

## 2020-07-16 DIAGNOSIS — Z8673 Personal history of transient ischemic attack (TIA), and cerebral infarction without residual deficits: Secondary | ICD-10-CM | POA: Diagnosis not present

## 2020-07-16 DIAGNOSIS — E785 Hyperlipidemia, unspecified: Secondary | ICD-10-CM | POA: Diagnosis not present

## 2020-07-16 DIAGNOSIS — M6281 Muscle weakness (generalized): Secondary | ICD-10-CM | POA: Diagnosis not present

## 2020-07-16 DIAGNOSIS — R2681 Unsteadiness on feet: Secondary | ICD-10-CM | POA: Diagnosis not present

## 2020-07-16 DIAGNOSIS — Q271 Congenital renal artery stenosis: Secondary | ICD-10-CM | POA: Diagnosis not present

## 2020-07-16 DIAGNOSIS — M6259 Muscle wasting and atrophy, not elsewhere classified, multiple sites: Secondary | ICD-10-CM | POA: Diagnosis not present

## 2020-07-16 DIAGNOSIS — N183 Chronic kidney disease, stage 3 unspecified: Secondary | ICD-10-CM | POA: Diagnosis not present

## 2020-07-17 DIAGNOSIS — C3412 Malignant neoplasm of upper lobe, left bronchus or lung: Secondary | ICD-10-CM | POA: Diagnosis not present

## 2020-07-17 DIAGNOSIS — N183 Chronic kidney disease, stage 3 unspecified: Secondary | ICD-10-CM | POA: Diagnosis not present

## 2020-07-17 DIAGNOSIS — Z8679 Personal history of other diseases of the circulatory system: Secondary | ICD-10-CM | POA: Diagnosis not present

## 2020-07-17 DIAGNOSIS — E785 Hyperlipidemia, unspecified: Secondary | ICD-10-CM | POA: Diagnosis not present

## 2020-07-17 DIAGNOSIS — R2681 Unsteadiness on feet: Secondary | ICD-10-CM | POA: Diagnosis not present

## 2020-07-17 DIAGNOSIS — Z8673 Personal history of transient ischemic attack (TIA), and cerebral infarction without residual deficits: Secondary | ICD-10-CM | POA: Diagnosis not present

## 2020-07-17 DIAGNOSIS — M6259 Muscle wasting and atrophy, not elsewhere classified, multiple sites: Secondary | ICD-10-CM | POA: Diagnosis not present

## 2020-07-17 DIAGNOSIS — I2510 Atherosclerotic heart disease of native coronary artery without angina pectoris: Secondary | ICD-10-CM | POA: Diagnosis not present

## 2020-07-17 DIAGNOSIS — Q271 Congenital renal artery stenosis: Secondary | ICD-10-CM | POA: Diagnosis not present

## 2020-07-17 DIAGNOSIS — M6281 Muscle weakness (generalized): Secondary | ICD-10-CM | POA: Diagnosis not present

## 2020-07-17 DIAGNOSIS — R1312 Dysphagia, oropharyngeal phase: Secondary | ICD-10-CM | POA: Diagnosis not present

## 2020-07-17 DIAGNOSIS — I10 Essential (primary) hypertension: Secondary | ICD-10-CM | POA: Diagnosis not present

## 2020-07-17 DIAGNOSIS — R2689 Other abnormalities of gait and mobility: Secondary | ICD-10-CM | POA: Diagnosis not present

## 2020-07-20 DIAGNOSIS — M6281 Muscle weakness (generalized): Secondary | ICD-10-CM | POA: Diagnosis not present

## 2020-07-20 DIAGNOSIS — E785 Hyperlipidemia, unspecified: Secondary | ICD-10-CM | POA: Diagnosis not present

## 2020-07-20 DIAGNOSIS — I10 Essential (primary) hypertension: Secondary | ICD-10-CM | POA: Diagnosis not present

## 2020-07-20 DIAGNOSIS — I2510 Atherosclerotic heart disease of native coronary artery without angina pectoris: Secondary | ICD-10-CM | POA: Diagnosis not present

## 2020-07-20 DIAGNOSIS — Z8673 Personal history of transient ischemic attack (TIA), and cerebral infarction without residual deficits: Secondary | ICD-10-CM | POA: Diagnosis not present

## 2020-07-20 DIAGNOSIS — M6259 Muscle wasting and atrophy, not elsewhere classified, multiple sites: Secondary | ICD-10-CM | POA: Diagnosis not present

## 2020-07-20 DIAGNOSIS — R1312 Dysphagia, oropharyngeal phase: Secondary | ICD-10-CM | POA: Diagnosis not present

## 2020-07-20 DIAGNOSIS — C3412 Malignant neoplasm of upper lobe, left bronchus or lung: Secondary | ICD-10-CM | POA: Diagnosis not present

## 2020-07-20 DIAGNOSIS — R2681 Unsteadiness on feet: Secondary | ICD-10-CM | POA: Diagnosis not present

## 2020-07-20 DIAGNOSIS — Q271 Congenital renal artery stenosis: Secondary | ICD-10-CM | POA: Diagnosis not present

## 2020-07-20 DIAGNOSIS — Z8679 Personal history of other diseases of the circulatory system: Secondary | ICD-10-CM | POA: Diagnosis not present

## 2020-07-20 DIAGNOSIS — N183 Chronic kidney disease, stage 3 unspecified: Secondary | ICD-10-CM | POA: Diagnosis not present

## 2020-07-20 DIAGNOSIS — R2689 Other abnormalities of gait and mobility: Secondary | ICD-10-CM | POA: Diagnosis not present

## 2020-07-21 DIAGNOSIS — I10 Essential (primary) hypertension: Secondary | ICD-10-CM | POA: Diagnosis not present

## 2020-07-21 DIAGNOSIS — Z8673 Personal history of transient ischemic attack (TIA), and cerebral infarction without residual deficits: Secondary | ICD-10-CM | POA: Diagnosis not present

## 2020-07-21 DIAGNOSIS — R1312 Dysphagia, oropharyngeal phase: Secondary | ICD-10-CM | POA: Diagnosis not present

## 2020-07-21 DIAGNOSIS — R2681 Unsteadiness on feet: Secondary | ICD-10-CM | POA: Diagnosis not present

## 2020-07-21 DIAGNOSIS — I2510 Atherosclerotic heart disease of native coronary artery without angina pectoris: Secondary | ICD-10-CM | POA: Diagnosis not present

## 2020-07-21 DIAGNOSIS — C3412 Malignant neoplasm of upper lobe, left bronchus or lung: Secondary | ICD-10-CM | POA: Diagnosis not present

## 2020-07-21 DIAGNOSIS — Q271 Congenital renal artery stenosis: Secondary | ICD-10-CM | POA: Diagnosis not present

## 2020-07-21 DIAGNOSIS — E785 Hyperlipidemia, unspecified: Secondary | ICD-10-CM | POA: Diagnosis not present

## 2020-07-21 DIAGNOSIS — N183 Chronic kidney disease, stage 3 unspecified: Secondary | ICD-10-CM | POA: Diagnosis not present

## 2020-07-21 DIAGNOSIS — Z8679 Personal history of other diseases of the circulatory system: Secondary | ICD-10-CM | POA: Diagnosis not present

## 2020-07-21 DIAGNOSIS — M6281 Muscle weakness (generalized): Secondary | ICD-10-CM | POA: Diagnosis not present

## 2020-07-21 DIAGNOSIS — M6259 Muscle wasting and atrophy, not elsewhere classified, multiple sites: Secondary | ICD-10-CM | POA: Diagnosis not present

## 2020-07-21 DIAGNOSIS — R2689 Other abnormalities of gait and mobility: Secondary | ICD-10-CM | POA: Diagnosis not present

## 2020-07-22 DIAGNOSIS — I2510 Atherosclerotic heart disease of native coronary artery without angina pectoris: Secondary | ICD-10-CM | POA: Diagnosis not present

## 2020-07-22 DIAGNOSIS — M6281 Muscle weakness (generalized): Secondary | ICD-10-CM | POA: Diagnosis not present

## 2020-07-22 DIAGNOSIS — R2681 Unsteadiness on feet: Secondary | ICD-10-CM | POA: Diagnosis not present

## 2020-07-22 DIAGNOSIS — Z8673 Personal history of transient ischemic attack (TIA), and cerebral infarction without residual deficits: Secondary | ICD-10-CM | POA: Diagnosis not present

## 2020-07-22 DIAGNOSIS — E785 Hyperlipidemia, unspecified: Secondary | ICD-10-CM | POA: Diagnosis not present

## 2020-07-22 DIAGNOSIS — N183 Chronic kidney disease, stage 3 unspecified: Secondary | ICD-10-CM | POA: Diagnosis not present

## 2020-07-22 DIAGNOSIS — R2689 Other abnormalities of gait and mobility: Secondary | ICD-10-CM | POA: Diagnosis not present

## 2020-07-22 DIAGNOSIS — I10 Essential (primary) hypertension: Secondary | ICD-10-CM | POA: Diagnosis not present

## 2020-07-22 DIAGNOSIS — C3412 Malignant neoplasm of upper lobe, left bronchus or lung: Secondary | ICD-10-CM | POA: Diagnosis not present

## 2020-07-22 DIAGNOSIS — M6259 Muscle wasting and atrophy, not elsewhere classified, multiple sites: Secondary | ICD-10-CM | POA: Diagnosis not present

## 2020-07-22 DIAGNOSIS — Q271 Congenital renal artery stenosis: Secondary | ICD-10-CM | POA: Diagnosis not present

## 2020-07-22 DIAGNOSIS — Z8679 Personal history of other diseases of the circulatory system: Secondary | ICD-10-CM | POA: Diagnosis not present

## 2020-07-22 DIAGNOSIS — R1312 Dysphagia, oropharyngeal phase: Secondary | ICD-10-CM | POA: Diagnosis not present

## 2020-07-23 DIAGNOSIS — R2689 Other abnormalities of gait and mobility: Secondary | ICD-10-CM | POA: Diagnosis not present

## 2020-07-23 DIAGNOSIS — N183 Chronic kidney disease, stage 3 unspecified: Secondary | ICD-10-CM | POA: Diagnosis not present

## 2020-07-23 DIAGNOSIS — Z8679 Personal history of other diseases of the circulatory system: Secondary | ICD-10-CM | POA: Diagnosis not present

## 2020-07-23 DIAGNOSIS — I10 Essential (primary) hypertension: Secondary | ICD-10-CM | POA: Diagnosis not present

## 2020-07-23 DIAGNOSIS — Q271 Congenital renal artery stenosis: Secondary | ICD-10-CM | POA: Diagnosis not present

## 2020-07-23 DIAGNOSIS — R1312 Dysphagia, oropharyngeal phase: Secondary | ICD-10-CM | POA: Diagnosis not present

## 2020-07-23 DIAGNOSIS — E785 Hyperlipidemia, unspecified: Secondary | ICD-10-CM | POA: Diagnosis not present

## 2020-07-23 DIAGNOSIS — R2681 Unsteadiness on feet: Secondary | ICD-10-CM | POA: Diagnosis not present

## 2020-07-23 DIAGNOSIS — M6281 Muscle weakness (generalized): Secondary | ICD-10-CM | POA: Diagnosis not present

## 2020-07-23 DIAGNOSIS — I2510 Atherosclerotic heart disease of native coronary artery without angina pectoris: Secondary | ICD-10-CM | POA: Diagnosis not present

## 2020-07-23 DIAGNOSIS — C3412 Malignant neoplasm of upper lobe, left bronchus or lung: Secondary | ICD-10-CM | POA: Diagnosis not present

## 2020-07-23 DIAGNOSIS — Z8673 Personal history of transient ischemic attack (TIA), and cerebral infarction without residual deficits: Secondary | ICD-10-CM | POA: Diagnosis not present

## 2020-07-23 DIAGNOSIS — M6259 Muscle wasting and atrophy, not elsewhere classified, multiple sites: Secondary | ICD-10-CM | POA: Diagnosis not present

## 2020-07-24 DIAGNOSIS — N183 Chronic kidney disease, stage 3 unspecified: Secondary | ICD-10-CM | POA: Diagnosis not present

## 2020-07-24 DIAGNOSIS — R2681 Unsteadiness on feet: Secondary | ICD-10-CM | POA: Diagnosis not present

## 2020-07-24 DIAGNOSIS — Z8673 Personal history of transient ischemic attack (TIA), and cerebral infarction without residual deficits: Secondary | ICD-10-CM | POA: Diagnosis not present

## 2020-07-24 DIAGNOSIS — R1312 Dysphagia, oropharyngeal phase: Secondary | ICD-10-CM | POA: Diagnosis not present

## 2020-07-24 DIAGNOSIS — R918 Other nonspecific abnormal finding of lung field: Secondary | ICD-10-CM

## 2020-07-24 DIAGNOSIS — E785 Hyperlipidemia, unspecified: Secondary | ICD-10-CM | POA: Diagnosis not present

## 2020-07-24 DIAGNOSIS — Q271 Congenital renal artery stenosis: Secondary | ICD-10-CM | POA: Diagnosis not present

## 2020-07-24 DIAGNOSIS — I2510 Atherosclerotic heart disease of native coronary artery without angina pectoris: Secondary | ICD-10-CM | POA: Diagnosis not present

## 2020-07-24 DIAGNOSIS — M6281 Muscle weakness (generalized): Secondary | ICD-10-CM | POA: Diagnosis not present

## 2020-07-24 DIAGNOSIS — R2689 Other abnormalities of gait and mobility: Secondary | ICD-10-CM | POA: Diagnosis not present

## 2020-07-24 DIAGNOSIS — C3412 Malignant neoplasm of upper lobe, left bronchus or lung: Secondary | ICD-10-CM | POA: Diagnosis not present

## 2020-07-24 DIAGNOSIS — M6259 Muscle wasting and atrophy, not elsewhere classified, multiple sites: Secondary | ICD-10-CM | POA: Diagnosis not present

## 2020-07-24 DIAGNOSIS — Z8679 Personal history of other diseases of the circulatory system: Secondary | ICD-10-CM | POA: Diagnosis not present

## 2020-07-24 DIAGNOSIS — I10 Essential (primary) hypertension: Secondary | ICD-10-CM | POA: Diagnosis not present

## 2020-07-27 DIAGNOSIS — E785 Hyperlipidemia, unspecified: Secondary | ICD-10-CM | POA: Diagnosis not present

## 2020-07-27 DIAGNOSIS — I2510 Atherosclerotic heart disease of native coronary artery without angina pectoris: Secondary | ICD-10-CM | POA: Diagnosis not present

## 2020-07-27 DIAGNOSIS — M6281 Muscle weakness (generalized): Secondary | ICD-10-CM | POA: Diagnosis not present

## 2020-07-27 DIAGNOSIS — I10 Essential (primary) hypertension: Secondary | ICD-10-CM | POA: Diagnosis not present

## 2020-07-27 DIAGNOSIS — C3412 Malignant neoplasm of upper lobe, left bronchus or lung: Secondary | ICD-10-CM | POA: Diagnosis not present

## 2020-07-27 DIAGNOSIS — M6259 Muscle wasting and atrophy, not elsewhere classified, multiple sites: Secondary | ICD-10-CM | POA: Diagnosis not present

## 2020-07-27 DIAGNOSIS — R2681 Unsteadiness on feet: Secondary | ICD-10-CM | POA: Diagnosis not present

## 2020-07-27 DIAGNOSIS — N183 Chronic kidney disease, stage 3 unspecified: Secondary | ICD-10-CM | POA: Diagnosis not present

## 2020-07-27 DIAGNOSIS — R1312 Dysphagia, oropharyngeal phase: Secondary | ICD-10-CM | POA: Diagnosis not present

## 2020-07-27 DIAGNOSIS — R2689 Other abnormalities of gait and mobility: Secondary | ICD-10-CM | POA: Diagnosis not present

## 2020-07-27 DIAGNOSIS — Z8673 Personal history of transient ischemic attack (TIA), and cerebral infarction without residual deficits: Secondary | ICD-10-CM | POA: Diagnosis not present

## 2020-07-27 DIAGNOSIS — Q271 Congenital renal artery stenosis: Secondary | ICD-10-CM | POA: Diagnosis not present

## 2020-07-27 DIAGNOSIS — Z8679 Personal history of other diseases of the circulatory system: Secondary | ICD-10-CM | POA: Diagnosis not present

## 2020-07-28 DIAGNOSIS — R2681 Unsteadiness on feet: Secondary | ICD-10-CM | POA: Diagnosis not present

## 2020-07-28 DIAGNOSIS — C3412 Malignant neoplasm of upper lobe, left bronchus or lung: Secondary | ICD-10-CM | POA: Diagnosis not present

## 2020-07-28 DIAGNOSIS — I2510 Atherosclerotic heart disease of native coronary artery without angina pectoris: Secondary | ICD-10-CM | POA: Diagnosis not present

## 2020-07-28 DIAGNOSIS — R1312 Dysphagia, oropharyngeal phase: Secondary | ICD-10-CM | POA: Diagnosis not present

## 2020-07-28 DIAGNOSIS — Q271 Congenital renal artery stenosis: Secondary | ICD-10-CM | POA: Diagnosis not present

## 2020-07-28 DIAGNOSIS — I10 Essential (primary) hypertension: Secondary | ICD-10-CM | POA: Diagnosis not present

## 2020-07-28 DIAGNOSIS — N183 Chronic kidney disease, stage 3 unspecified: Secondary | ICD-10-CM | POA: Diagnosis not present

## 2020-07-28 DIAGNOSIS — R2689 Other abnormalities of gait and mobility: Secondary | ICD-10-CM | POA: Diagnosis not present

## 2020-07-28 DIAGNOSIS — M6281 Muscle weakness (generalized): Secondary | ICD-10-CM | POA: Diagnosis not present

## 2020-07-28 DIAGNOSIS — M6259 Muscle wasting and atrophy, not elsewhere classified, multiple sites: Secondary | ICD-10-CM | POA: Diagnosis not present

## 2020-07-28 DIAGNOSIS — Z8679 Personal history of other diseases of the circulatory system: Secondary | ICD-10-CM | POA: Diagnosis not present

## 2020-07-28 DIAGNOSIS — E785 Hyperlipidemia, unspecified: Secondary | ICD-10-CM | POA: Diagnosis not present

## 2020-07-28 DIAGNOSIS — Z8673 Personal history of transient ischemic attack (TIA), and cerebral infarction without residual deficits: Secondary | ICD-10-CM | POA: Diagnosis not present

## 2020-07-29 DIAGNOSIS — R2689 Other abnormalities of gait and mobility: Secondary | ICD-10-CM | POA: Diagnosis not present

## 2020-07-29 DIAGNOSIS — E785 Hyperlipidemia, unspecified: Secondary | ICD-10-CM | POA: Diagnosis not present

## 2020-07-29 DIAGNOSIS — Q271 Congenital renal artery stenosis: Secondary | ICD-10-CM | POA: Diagnosis not present

## 2020-07-29 DIAGNOSIS — Z8673 Personal history of transient ischemic attack (TIA), and cerebral infarction without residual deficits: Secondary | ICD-10-CM | POA: Diagnosis not present

## 2020-07-29 DIAGNOSIS — M6281 Muscle weakness (generalized): Secondary | ICD-10-CM | POA: Diagnosis not present

## 2020-07-29 DIAGNOSIS — R1312 Dysphagia, oropharyngeal phase: Secondary | ICD-10-CM | POA: Diagnosis not present

## 2020-07-29 DIAGNOSIS — M6259 Muscle wasting and atrophy, not elsewhere classified, multiple sites: Secondary | ICD-10-CM | POA: Diagnosis not present

## 2020-07-29 DIAGNOSIS — R2681 Unsteadiness on feet: Secondary | ICD-10-CM | POA: Diagnosis not present

## 2020-07-29 DIAGNOSIS — C3412 Malignant neoplasm of upper lobe, left bronchus or lung: Secondary | ICD-10-CM | POA: Diagnosis not present

## 2020-07-29 DIAGNOSIS — Z8679 Personal history of other diseases of the circulatory system: Secondary | ICD-10-CM | POA: Diagnosis not present

## 2020-07-29 DIAGNOSIS — N183 Chronic kidney disease, stage 3 unspecified: Secondary | ICD-10-CM | POA: Diagnosis not present

## 2020-07-29 DIAGNOSIS — I2510 Atherosclerotic heart disease of native coronary artery without angina pectoris: Secondary | ICD-10-CM | POA: Diagnosis not present

## 2020-07-29 DIAGNOSIS — I10 Essential (primary) hypertension: Secondary | ICD-10-CM | POA: Diagnosis not present

## 2020-07-30 DIAGNOSIS — R1312 Dysphagia, oropharyngeal phase: Secondary | ICD-10-CM | POA: Diagnosis not present

## 2020-07-30 DIAGNOSIS — Q271 Congenital renal artery stenosis: Secondary | ICD-10-CM | POA: Diagnosis not present

## 2020-07-30 DIAGNOSIS — R2681 Unsteadiness on feet: Secondary | ICD-10-CM | POA: Diagnosis not present

## 2020-07-30 DIAGNOSIS — I2510 Atherosclerotic heart disease of native coronary artery without angina pectoris: Secondary | ICD-10-CM | POA: Diagnosis not present

## 2020-07-30 DIAGNOSIS — I10 Essential (primary) hypertension: Secondary | ICD-10-CM | POA: Diagnosis not present

## 2020-07-30 DIAGNOSIS — R2689 Other abnormalities of gait and mobility: Secondary | ICD-10-CM | POA: Diagnosis not present

## 2020-07-30 DIAGNOSIS — C3412 Malignant neoplasm of upper lobe, left bronchus or lung: Secondary | ICD-10-CM | POA: Diagnosis not present

## 2020-07-30 DIAGNOSIS — Z8679 Personal history of other diseases of the circulatory system: Secondary | ICD-10-CM | POA: Diagnosis not present

## 2020-07-30 DIAGNOSIS — Z8673 Personal history of transient ischemic attack (TIA), and cerebral infarction without residual deficits: Secondary | ICD-10-CM | POA: Diagnosis not present

## 2020-07-30 DIAGNOSIS — M6281 Muscle weakness (generalized): Secondary | ICD-10-CM | POA: Diagnosis not present

## 2020-07-30 DIAGNOSIS — E785 Hyperlipidemia, unspecified: Secondary | ICD-10-CM | POA: Diagnosis not present

## 2020-07-30 DIAGNOSIS — M6259 Muscle wasting and atrophy, not elsewhere classified, multiple sites: Secondary | ICD-10-CM | POA: Diagnosis not present

## 2020-07-30 DIAGNOSIS — N183 Chronic kidney disease, stage 3 unspecified: Secondary | ICD-10-CM | POA: Diagnosis not present

## 2020-07-31 DIAGNOSIS — I10 Essential (primary) hypertension: Secondary | ICD-10-CM | POA: Diagnosis not present

## 2020-07-31 DIAGNOSIS — R1312 Dysphagia, oropharyngeal phase: Secondary | ICD-10-CM | POA: Diagnosis not present

## 2020-07-31 DIAGNOSIS — M6281 Muscle weakness (generalized): Secondary | ICD-10-CM | POA: Diagnosis not present

## 2020-07-31 DIAGNOSIS — Q271 Congenital renal artery stenosis: Secondary | ICD-10-CM | POA: Diagnosis not present

## 2020-07-31 DIAGNOSIS — I2510 Atherosclerotic heart disease of native coronary artery without angina pectoris: Secondary | ICD-10-CM | POA: Diagnosis not present

## 2020-07-31 DIAGNOSIS — Z8673 Personal history of transient ischemic attack (TIA), and cerebral infarction without residual deficits: Secondary | ICD-10-CM | POA: Diagnosis not present

## 2020-07-31 DIAGNOSIS — M6259 Muscle wasting and atrophy, not elsewhere classified, multiple sites: Secondary | ICD-10-CM | POA: Diagnosis not present

## 2020-07-31 DIAGNOSIS — R2689 Other abnormalities of gait and mobility: Secondary | ICD-10-CM | POA: Diagnosis not present

## 2020-07-31 DIAGNOSIS — C3412 Malignant neoplasm of upper lobe, left bronchus or lung: Secondary | ICD-10-CM | POA: Diagnosis not present

## 2020-07-31 DIAGNOSIS — R2681 Unsteadiness on feet: Secondary | ICD-10-CM | POA: Diagnosis not present

## 2020-07-31 DIAGNOSIS — N183 Chronic kidney disease, stage 3 unspecified: Secondary | ICD-10-CM | POA: Diagnosis not present

## 2020-07-31 DIAGNOSIS — Z8679 Personal history of other diseases of the circulatory system: Secondary | ICD-10-CM | POA: Diagnosis not present

## 2020-07-31 DIAGNOSIS — E785 Hyperlipidemia, unspecified: Secondary | ICD-10-CM | POA: Diagnosis not present

## 2020-08-01 DIAGNOSIS — R2689 Other abnormalities of gait and mobility: Secondary | ICD-10-CM | POA: Diagnosis not present

## 2020-08-01 DIAGNOSIS — R2681 Unsteadiness on feet: Secondary | ICD-10-CM | POA: Diagnosis not present

## 2020-08-01 DIAGNOSIS — R1312 Dysphagia, oropharyngeal phase: Secondary | ICD-10-CM | POA: Diagnosis not present

## 2020-08-01 DIAGNOSIS — I10 Essential (primary) hypertension: Secondary | ICD-10-CM | POA: Diagnosis not present

## 2020-08-01 DIAGNOSIS — I2510 Atherosclerotic heart disease of native coronary artery without angina pectoris: Secondary | ICD-10-CM | POA: Diagnosis not present

## 2020-08-01 DIAGNOSIS — N183 Chronic kidney disease, stage 3 unspecified: Secondary | ICD-10-CM | POA: Diagnosis not present

## 2020-08-01 DIAGNOSIS — Q271 Congenital renal artery stenosis: Secondary | ICD-10-CM | POA: Diagnosis not present

## 2020-08-01 DIAGNOSIS — Z8679 Personal history of other diseases of the circulatory system: Secondary | ICD-10-CM | POA: Diagnosis not present

## 2020-08-01 DIAGNOSIS — C3412 Malignant neoplasm of upper lobe, left bronchus or lung: Secondary | ICD-10-CM | POA: Diagnosis not present

## 2020-08-01 DIAGNOSIS — M6259 Muscle wasting and atrophy, not elsewhere classified, multiple sites: Secondary | ICD-10-CM | POA: Diagnosis not present

## 2020-08-01 DIAGNOSIS — M6281 Muscle weakness (generalized): Secondary | ICD-10-CM | POA: Diagnosis not present

## 2020-08-01 DIAGNOSIS — E785 Hyperlipidemia, unspecified: Secondary | ICD-10-CM | POA: Diagnosis not present

## 2020-08-01 DIAGNOSIS — Z8673 Personal history of transient ischemic attack (TIA), and cerebral infarction without residual deficits: Secondary | ICD-10-CM | POA: Diagnosis not present

## 2020-08-02 DIAGNOSIS — Z8673 Personal history of transient ischemic attack (TIA), and cerebral infarction without residual deficits: Secondary | ICD-10-CM | POA: Diagnosis not present

## 2020-08-02 DIAGNOSIS — I2510 Atherosclerotic heart disease of native coronary artery without angina pectoris: Secondary | ICD-10-CM | POA: Diagnosis not present

## 2020-08-02 DIAGNOSIS — C3412 Malignant neoplasm of upper lobe, left bronchus or lung: Secondary | ICD-10-CM | POA: Diagnosis not present

## 2020-08-02 DIAGNOSIS — Z8679 Personal history of other diseases of the circulatory system: Secondary | ICD-10-CM | POA: Diagnosis not present

## 2020-08-02 DIAGNOSIS — E785 Hyperlipidemia, unspecified: Secondary | ICD-10-CM | POA: Diagnosis not present

## 2020-08-02 DIAGNOSIS — M6259 Muscle wasting and atrophy, not elsewhere classified, multiple sites: Secondary | ICD-10-CM | POA: Diagnosis not present

## 2020-08-02 DIAGNOSIS — I10 Essential (primary) hypertension: Secondary | ICD-10-CM | POA: Diagnosis not present

## 2020-08-02 DIAGNOSIS — M6281 Muscle weakness (generalized): Secondary | ICD-10-CM | POA: Diagnosis not present

## 2020-08-02 DIAGNOSIS — R1312 Dysphagia, oropharyngeal phase: Secondary | ICD-10-CM | POA: Diagnosis not present

## 2020-08-02 DIAGNOSIS — R2689 Other abnormalities of gait and mobility: Secondary | ICD-10-CM | POA: Diagnosis not present

## 2020-08-02 DIAGNOSIS — R2681 Unsteadiness on feet: Secondary | ICD-10-CM | POA: Diagnosis not present

## 2020-08-02 DIAGNOSIS — Q271 Congenital renal artery stenosis: Secondary | ICD-10-CM | POA: Diagnosis not present

## 2020-08-02 DIAGNOSIS — N183 Chronic kidney disease, stage 3 unspecified: Secondary | ICD-10-CM | POA: Diagnosis not present

## 2020-08-03 ENCOUNTER — Telehealth: Payer: Self-pay

## 2020-08-03 DIAGNOSIS — M6259 Muscle wasting and atrophy, not elsewhere classified, multiple sites: Secondary | ICD-10-CM | POA: Diagnosis not present

## 2020-08-03 DIAGNOSIS — Z8673 Personal history of transient ischemic attack (TIA), and cerebral infarction without residual deficits: Secondary | ICD-10-CM | POA: Diagnosis not present

## 2020-08-03 DIAGNOSIS — M6281 Muscle weakness (generalized): Secondary | ICD-10-CM | POA: Diagnosis not present

## 2020-08-03 DIAGNOSIS — I2510 Atherosclerotic heart disease of native coronary artery without angina pectoris: Secondary | ICD-10-CM | POA: Diagnosis not present

## 2020-08-03 DIAGNOSIS — N183 Chronic kidney disease, stage 3 unspecified: Secondary | ICD-10-CM | POA: Diagnosis not present

## 2020-08-03 DIAGNOSIS — C3412 Malignant neoplasm of upper lobe, left bronchus or lung: Secondary | ICD-10-CM | POA: Diagnosis not present

## 2020-08-03 DIAGNOSIS — E785 Hyperlipidemia, unspecified: Secondary | ICD-10-CM | POA: Diagnosis not present

## 2020-08-03 DIAGNOSIS — R2689 Other abnormalities of gait and mobility: Secondary | ICD-10-CM | POA: Diagnosis not present

## 2020-08-03 DIAGNOSIS — R2681 Unsteadiness on feet: Secondary | ICD-10-CM | POA: Diagnosis not present

## 2020-08-03 DIAGNOSIS — I10 Essential (primary) hypertension: Secondary | ICD-10-CM | POA: Diagnosis not present

## 2020-08-03 DIAGNOSIS — Q271 Congenital renal artery stenosis: Secondary | ICD-10-CM | POA: Diagnosis not present

## 2020-08-03 DIAGNOSIS — R1312 Dysphagia, oropharyngeal phase: Secondary | ICD-10-CM | POA: Diagnosis not present

## 2020-08-03 DIAGNOSIS — Z8679 Personal history of other diseases of the circulatory system: Secondary | ICD-10-CM | POA: Diagnosis not present

## 2020-08-03 NOTE — Telephone Encounter (Signed)
Attempted to leave voicemail message in regards to telephone appointment with Freeman Caldron PA on 08/05/20 @ 11:00am. Phone rang and then went busy. Could not leave a message. TM

## 2020-08-03 NOTE — Telephone Encounter (Signed)
Attempted to call patient in regards to telephone visit with Freeman Caldron PA on 08/05/20 @ 11:00am. Could not leave patient a voicemail message in regards to visit. TM

## 2020-08-04 DIAGNOSIS — R2689 Other abnormalities of gait and mobility: Secondary | ICD-10-CM | POA: Diagnosis not present

## 2020-08-04 DIAGNOSIS — I2510 Atherosclerotic heart disease of native coronary artery without angina pectoris: Secondary | ICD-10-CM | POA: Diagnosis not present

## 2020-08-04 DIAGNOSIS — M6281 Muscle weakness (generalized): Secondary | ICD-10-CM | POA: Diagnosis not present

## 2020-08-04 DIAGNOSIS — C3412 Malignant neoplasm of upper lobe, left bronchus or lung: Secondary | ICD-10-CM | POA: Diagnosis not present

## 2020-08-04 DIAGNOSIS — Z8679 Personal history of other diseases of the circulatory system: Secondary | ICD-10-CM | POA: Diagnosis not present

## 2020-08-04 DIAGNOSIS — Q271 Congenital renal artery stenosis: Secondary | ICD-10-CM | POA: Diagnosis not present

## 2020-08-04 DIAGNOSIS — R1312 Dysphagia, oropharyngeal phase: Secondary | ICD-10-CM | POA: Diagnosis not present

## 2020-08-04 DIAGNOSIS — E785 Hyperlipidemia, unspecified: Secondary | ICD-10-CM | POA: Diagnosis not present

## 2020-08-04 DIAGNOSIS — N183 Chronic kidney disease, stage 3 unspecified: Secondary | ICD-10-CM | POA: Diagnosis not present

## 2020-08-04 DIAGNOSIS — Z8673 Personal history of transient ischemic attack (TIA), and cerebral infarction without residual deficits: Secondary | ICD-10-CM | POA: Diagnosis not present

## 2020-08-04 DIAGNOSIS — M6259 Muscle wasting and atrophy, not elsewhere classified, multiple sites: Secondary | ICD-10-CM | POA: Diagnosis not present

## 2020-08-04 DIAGNOSIS — I10 Essential (primary) hypertension: Secondary | ICD-10-CM | POA: Diagnosis not present

## 2020-08-04 DIAGNOSIS — R2681 Unsteadiness on feet: Secondary | ICD-10-CM | POA: Diagnosis not present

## 2020-08-05 ENCOUNTER — Ambulatory Visit
Admission: RE | Admit: 2020-08-05 | Discharge: 2020-08-05 | Disposition: A | Discharge status: Home / Self Care | Payer: Medicare Other | Source: Ambulatory Visit | Attending: Urology | Admitting: Urology

## 2020-08-05 ENCOUNTER — Telehealth: Payer: Self-pay | Admitting: Urology

## 2020-08-05 ENCOUNTER — Other Ambulatory Visit: Payer: Self-pay

## 2020-08-05 NOTE — Telephone Encounter (Signed)
Unable to reach the patient or his daughter, Jocelyn Lamer, to conduct the 1 month follow-up visit by telephone as scheduled.  I did send a message advising them to call back if they have any questions or concerns related to Henry Allen recent radiotherapy.  Nicholos Johns, MMS, PA-C [facility] [place]: [phone]  Fax: [phone]

## 2020-08-11 DIAGNOSIS — I679 Cerebrovascular disease, unspecified: Secondary | ICD-10-CM | POA: Diagnosis not present

## 2020-08-11 DIAGNOSIS — Z8616 Personal history of COVID-19: Secondary | ICD-10-CM | POA: Diagnosis not present

## 2020-08-11 DIAGNOSIS — I10 Essential (primary) hypertension: Secondary | ICD-10-CM | POA: Diagnosis not present

## 2020-08-11 DIAGNOSIS — I2510 Atherosclerotic heart disease of native coronary artery without angina pectoris: Secondary | ICD-10-CM | POA: Diagnosis not present

## 2020-08-21 DIAGNOSIS — I739 Peripheral vascular disease, unspecified: Secondary | ICD-10-CM | POA: Diagnosis not present

## 2020-08-21 DIAGNOSIS — M2042 Other hammer toe(s) (acquired), left foot: Secondary | ICD-10-CM | POA: Diagnosis not present

## 2020-08-21 DIAGNOSIS — M2041 Other hammer toe(s) (acquired), right foot: Secondary | ICD-10-CM | POA: Diagnosis not present

## 2020-08-21 DIAGNOSIS — B351 Tinea unguium: Secondary | ICD-10-CM | POA: Diagnosis not present

## 2020-08-27 ENCOUNTER — Telehealth: Payer: Self-pay

## 2020-08-27 NOTE — Telephone Encounter (Signed)
Spoke with Olegario Shearer patients daughter. She has been unable to reach anyone about her father as she lives in [place]. I went over the palliative care note and gave her the name and number of the palliative care company that is seeing her father and encouraged her to call if she needed more information.

## 2020-08-28 DIAGNOSIS — W1789XA Other fall from one level to another, initial encounter: Secondary | ICD-10-CM | POA: Diagnosis not present

## 2020-08-28 DIAGNOSIS — R2689 Other abnormalities of gait and mobility: Secondary | ICD-10-CM | POA: Diagnosis not present

## 2020-09-03 DIAGNOSIS — H25813 Combined forms of age-related cataract, bilateral: Secondary | ICD-10-CM | POA: Diagnosis not present

## 2020-09-03 DIAGNOSIS — Q271 Congenital renal artery stenosis: Secondary | ICD-10-CM | POA: Diagnosis not present

## 2020-09-03 DIAGNOSIS — Z8679 Personal history of other diseases of the circulatory system: Secondary | ICD-10-CM | POA: Diagnosis not present

## 2020-09-03 DIAGNOSIS — H524 Presbyopia: Secondary | ICD-10-CM | POA: Diagnosis not present

## 2020-09-03 DIAGNOSIS — Z8673 Personal history of transient ischemic attack (TIA), and cerebral infarction without residual deficits: Secondary | ICD-10-CM | POA: Diagnosis not present

## 2020-09-03 DIAGNOSIS — N183 Chronic kidney disease, stage 3 unspecified: Secondary | ICD-10-CM | POA: Diagnosis not present

## 2020-09-03 DIAGNOSIS — I2510 Atherosclerotic heart disease of native coronary artery without angina pectoris: Secondary | ICD-10-CM | POA: Diagnosis not present

## 2020-09-03 DIAGNOSIS — E785 Hyperlipidemia, unspecified: Secondary | ICD-10-CM | POA: Diagnosis not present

## 2020-09-03 DIAGNOSIS — M6281 Muscle weakness (generalized): Secondary | ICD-10-CM | POA: Diagnosis not present

## 2020-09-03 DIAGNOSIS — R1312 Dysphagia, oropharyngeal phase: Secondary | ICD-10-CM | POA: Diagnosis not present

## 2020-09-03 DIAGNOSIS — R2681 Unsteadiness on feet: Secondary | ICD-10-CM | POA: Diagnosis not present

## 2020-09-03 DIAGNOSIS — I10 Essential (primary) hypertension: Secondary | ICD-10-CM | POA: Diagnosis not present

## 2020-09-03 DIAGNOSIS — C3412 Malignant neoplasm of upper lobe, left bronchus or lung: Secondary | ICD-10-CM | POA: Diagnosis not present

## 2020-09-03 DIAGNOSIS — R2689 Other abnormalities of gait and mobility: Secondary | ICD-10-CM | POA: Diagnosis not present

## 2020-09-03 DIAGNOSIS — M6259 Muscle wasting and atrophy, not elsewhere classified, multiple sites: Secondary | ICD-10-CM | POA: Diagnosis not present

## 2020-09-04 DIAGNOSIS — E785 Hyperlipidemia, unspecified: Secondary | ICD-10-CM | POA: Diagnosis not present

## 2020-09-04 DIAGNOSIS — C3412 Malignant neoplasm of upper lobe, left bronchus or lung: Secondary | ICD-10-CM | POA: Diagnosis not present

## 2020-09-04 DIAGNOSIS — R1312 Dysphagia, oropharyngeal phase: Secondary | ICD-10-CM | POA: Diagnosis not present

## 2020-09-04 DIAGNOSIS — I10 Essential (primary) hypertension: Secondary | ICD-10-CM | POA: Diagnosis not present

## 2020-09-04 DIAGNOSIS — N183 Chronic kidney disease, stage 3 unspecified: Secondary | ICD-10-CM | POA: Diagnosis not present

## 2020-09-04 DIAGNOSIS — R2689 Other abnormalities of gait and mobility: Secondary | ICD-10-CM | POA: Diagnosis not present

## 2020-09-04 DIAGNOSIS — Z8679 Personal history of other diseases of the circulatory system: Secondary | ICD-10-CM | POA: Diagnosis not present

## 2020-09-04 DIAGNOSIS — M6259 Muscle wasting and atrophy, not elsewhere classified, multiple sites: Secondary | ICD-10-CM | POA: Diagnosis not present

## 2020-09-04 DIAGNOSIS — Q271 Congenital renal artery stenosis: Secondary | ICD-10-CM | POA: Diagnosis not present

## 2020-09-04 DIAGNOSIS — Z8673 Personal history of transient ischemic attack (TIA), and cerebral infarction without residual deficits: Secondary | ICD-10-CM | POA: Diagnosis not present

## 2020-09-04 DIAGNOSIS — I2510 Atherosclerotic heart disease of native coronary artery without angina pectoris: Secondary | ICD-10-CM | POA: Diagnosis not present

## 2020-09-04 DIAGNOSIS — R2681 Unsteadiness on feet: Secondary | ICD-10-CM | POA: Diagnosis not present

## 2020-09-04 DIAGNOSIS — M6281 Muscle weakness (generalized): Secondary | ICD-10-CM | POA: Diagnosis not present

## 2020-09-07 DIAGNOSIS — N183 Chronic kidney disease, stage 3 unspecified: Secondary | ICD-10-CM | POA: Diagnosis not present

## 2020-09-07 DIAGNOSIS — I10 Essential (primary) hypertension: Secondary | ICD-10-CM | POA: Diagnosis not present

## 2020-09-07 DIAGNOSIS — I2510 Atherosclerotic heart disease of native coronary artery without angina pectoris: Secondary | ICD-10-CM | POA: Diagnosis not present

## 2020-09-07 DIAGNOSIS — Q271 Congenital renal artery stenosis: Secondary | ICD-10-CM | POA: Diagnosis not present

## 2020-09-07 DIAGNOSIS — Z8679 Personal history of other diseases of the circulatory system: Secondary | ICD-10-CM | POA: Diagnosis not present

## 2020-09-07 DIAGNOSIS — R1312 Dysphagia, oropharyngeal phase: Secondary | ICD-10-CM | POA: Diagnosis not present

## 2020-09-07 DIAGNOSIS — M6281 Muscle weakness (generalized): Secondary | ICD-10-CM | POA: Diagnosis not present

## 2020-09-07 DIAGNOSIS — M6259 Muscle wasting and atrophy, not elsewhere classified, multiple sites: Secondary | ICD-10-CM | POA: Diagnosis not present

## 2020-09-07 DIAGNOSIS — Z8673 Personal history of transient ischemic attack (TIA), and cerebral infarction without residual deficits: Secondary | ICD-10-CM | POA: Diagnosis not present

## 2020-09-07 DIAGNOSIS — C3412 Malignant neoplasm of upper lobe, left bronchus or lung: Secondary | ICD-10-CM | POA: Diagnosis not present

## 2020-09-07 DIAGNOSIS — E785 Hyperlipidemia, unspecified: Secondary | ICD-10-CM | POA: Diagnosis not present

## 2020-09-07 DIAGNOSIS — R2681 Unsteadiness on feet: Secondary | ICD-10-CM | POA: Diagnosis not present

## 2020-09-07 DIAGNOSIS — R2689 Other abnormalities of gait and mobility: Secondary | ICD-10-CM | POA: Diagnosis not present

## 2020-09-08 DIAGNOSIS — R1312 Dysphagia, oropharyngeal phase: Secondary | ICD-10-CM | POA: Diagnosis not present

## 2020-09-08 DIAGNOSIS — Z8679 Personal history of other diseases of the circulatory system: Secondary | ICD-10-CM | POA: Diagnosis not present

## 2020-09-08 DIAGNOSIS — R2689 Other abnormalities of gait and mobility: Secondary | ICD-10-CM | POA: Diagnosis not present

## 2020-09-08 DIAGNOSIS — I6789 Other cerebrovascular disease: Secondary | ICD-10-CM | POA: Diagnosis not present

## 2020-09-08 DIAGNOSIS — C3412 Malignant neoplasm of upper lobe, left bronchus or lung: Secondary | ICD-10-CM | POA: Diagnosis not present

## 2020-09-08 DIAGNOSIS — Q271 Congenital renal artery stenosis: Secondary | ICD-10-CM | POA: Diagnosis not present

## 2020-09-08 DIAGNOSIS — E785 Hyperlipidemia, unspecified: Secondary | ICD-10-CM | POA: Diagnosis not present

## 2020-09-08 DIAGNOSIS — C3490 Malignant neoplasm of unspecified part of unspecified bronchus or lung: Secondary | ICD-10-CM | POA: Diagnosis not present

## 2020-09-08 DIAGNOSIS — I150 Renovascular hypertension: Secondary | ICD-10-CM | POA: Diagnosis not present

## 2020-09-08 DIAGNOSIS — M6281 Muscle weakness (generalized): Secondary | ICD-10-CM | POA: Diagnosis not present

## 2020-09-08 DIAGNOSIS — N183 Chronic kidney disease, stage 3 unspecified: Secondary | ICD-10-CM | POA: Diagnosis not present

## 2020-09-08 DIAGNOSIS — I2510 Atherosclerotic heart disease of native coronary artery without angina pectoris: Secondary | ICD-10-CM | POA: Diagnosis not present

## 2020-09-08 DIAGNOSIS — I10 Essential (primary) hypertension: Secondary | ICD-10-CM | POA: Diagnosis not present

## 2020-09-08 DIAGNOSIS — M6259 Muscle wasting and atrophy, not elsewhere classified, multiple sites: Secondary | ICD-10-CM | POA: Diagnosis not present

## 2020-09-08 DIAGNOSIS — R2681 Unsteadiness on feet: Secondary | ICD-10-CM | POA: Diagnosis not present

## 2020-09-08 DIAGNOSIS — I872 Venous insufficiency (chronic) (peripheral): Secondary | ICD-10-CM | POA: Diagnosis not present

## 2020-09-08 DIAGNOSIS — Z8673 Personal history of transient ischemic attack (TIA), and cerebral infarction without residual deficits: Secondary | ICD-10-CM | POA: Diagnosis not present

## 2020-09-09 DIAGNOSIS — E785 Hyperlipidemia, unspecified: Secondary | ICD-10-CM | POA: Diagnosis not present

## 2020-09-09 DIAGNOSIS — Z8673 Personal history of transient ischemic attack (TIA), and cerebral infarction without residual deficits: Secondary | ICD-10-CM | POA: Diagnosis not present

## 2020-09-09 DIAGNOSIS — I10 Essential (primary) hypertension: Secondary | ICD-10-CM | POA: Diagnosis not present

## 2020-09-09 DIAGNOSIS — R2681 Unsteadiness on feet: Secondary | ICD-10-CM | POA: Diagnosis not present

## 2020-09-09 DIAGNOSIS — C3412 Malignant neoplasm of upper lobe, left bronchus or lung: Secondary | ICD-10-CM | POA: Diagnosis not present

## 2020-09-09 DIAGNOSIS — R1312 Dysphagia, oropharyngeal phase: Secondary | ICD-10-CM | POA: Diagnosis not present

## 2020-09-09 DIAGNOSIS — Q271 Congenital renal artery stenosis: Secondary | ICD-10-CM | POA: Diagnosis not present

## 2020-09-09 DIAGNOSIS — N183 Chronic kidney disease, stage 3 unspecified: Secondary | ICD-10-CM | POA: Diagnosis not present

## 2020-09-09 DIAGNOSIS — M6259 Muscle wasting and atrophy, not elsewhere classified, multiple sites: Secondary | ICD-10-CM | POA: Diagnosis not present

## 2020-09-09 DIAGNOSIS — Z8679 Personal history of other diseases of the circulatory system: Secondary | ICD-10-CM | POA: Diagnosis not present

## 2020-09-09 DIAGNOSIS — R2689 Other abnormalities of gait and mobility: Secondary | ICD-10-CM | POA: Diagnosis not present

## 2020-09-09 DIAGNOSIS — I2510 Atherosclerotic heart disease of native coronary artery without angina pectoris: Secondary | ICD-10-CM | POA: Diagnosis not present

## 2020-09-09 DIAGNOSIS — M6281 Muscle weakness (generalized): Secondary | ICD-10-CM | POA: Diagnosis not present

## 2020-09-10 DIAGNOSIS — Q271 Congenital renal artery stenosis: Secondary | ICD-10-CM | POA: Diagnosis not present

## 2020-09-10 DIAGNOSIS — N183 Chronic kidney disease, stage 3 unspecified: Secondary | ICD-10-CM | POA: Diagnosis not present

## 2020-09-10 DIAGNOSIS — I10 Essential (primary) hypertension: Secondary | ICD-10-CM | POA: Diagnosis not present

## 2020-09-10 DIAGNOSIS — R2689 Other abnormalities of gait and mobility: Secondary | ICD-10-CM | POA: Diagnosis not present

## 2020-09-10 DIAGNOSIS — I2510 Atherosclerotic heart disease of native coronary artery without angina pectoris: Secondary | ICD-10-CM | POA: Diagnosis not present

## 2020-09-10 DIAGNOSIS — R2681 Unsteadiness on feet: Secondary | ICD-10-CM | POA: Diagnosis not present

## 2020-09-10 DIAGNOSIS — C3412 Malignant neoplasm of upper lobe, left bronchus or lung: Secondary | ICD-10-CM | POA: Diagnosis not present

## 2020-09-10 DIAGNOSIS — Z8679 Personal history of other diseases of the circulatory system: Secondary | ICD-10-CM | POA: Diagnosis not present

## 2020-09-10 DIAGNOSIS — R1312 Dysphagia, oropharyngeal phase: Secondary | ICD-10-CM | POA: Diagnosis not present

## 2020-09-10 DIAGNOSIS — M6259 Muscle wasting and atrophy, not elsewhere classified, multiple sites: Secondary | ICD-10-CM | POA: Diagnosis not present

## 2020-09-10 DIAGNOSIS — Z8673 Personal history of transient ischemic attack (TIA), and cerebral infarction without residual deficits: Secondary | ICD-10-CM | POA: Diagnosis not present

## 2020-09-10 DIAGNOSIS — E785 Hyperlipidemia, unspecified: Secondary | ICD-10-CM | POA: Diagnosis not present

## 2020-09-10 DIAGNOSIS — M6281 Muscle weakness (generalized): Secondary | ICD-10-CM | POA: Diagnosis not present

## 2020-09-11 DIAGNOSIS — R2689 Other abnormalities of gait and mobility: Secondary | ICD-10-CM | POA: Diagnosis not present

## 2020-09-11 DIAGNOSIS — N183 Chronic kidney disease, stage 3 unspecified: Secondary | ICD-10-CM | POA: Diagnosis not present

## 2020-09-11 DIAGNOSIS — I2510 Atherosclerotic heart disease of native coronary artery without angina pectoris: Secondary | ICD-10-CM | POA: Diagnosis not present

## 2020-09-11 DIAGNOSIS — M6281 Muscle weakness (generalized): Secondary | ICD-10-CM | POA: Diagnosis not present

## 2020-09-11 DIAGNOSIS — I10 Essential (primary) hypertension: Secondary | ICD-10-CM | POA: Diagnosis not present

## 2020-09-11 DIAGNOSIS — Z8673 Personal history of transient ischemic attack (TIA), and cerebral infarction without residual deficits: Secondary | ICD-10-CM | POA: Diagnosis not present

## 2020-09-11 DIAGNOSIS — M6259 Muscle wasting and atrophy, not elsewhere classified, multiple sites: Secondary | ICD-10-CM | POA: Diagnosis not present

## 2020-09-11 DIAGNOSIS — R2681 Unsteadiness on feet: Secondary | ICD-10-CM | POA: Diagnosis not present

## 2020-09-11 DIAGNOSIS — R1312 Dysphagia, oropharyngeal phase: Secondary | ICD-10-CM | POA: Diagnosis not present

## 2020-09-11 DIAGNOSIS — Q271 Congenital renal artery stenosis: Secondary | ICD-10-CM | POA: Diagnosis not present

## 2020-09-11 DIAGNOSIS — Z8679 Personal history of other diseases of the circulatory system: Secondary | ICD-10-CM | POA: Diagnosis not present

## 2020-09-11 DIAGNOSIS — E785 Hyperlipidemia, unspecified: Secondary | ICD-10-CM | POA: Diagnosis not present

## 2020-09-11 DIAGNOSIS — C3412 Malignant neoplasm of upper lobe, left bronchus or lung: Secondary | ICD-10-CM | POA: Diagnosis not present

## 2020-09-14 DIAGNOSIS — R1312 Dysphagia, oropharyngeal phase: Secondary | ICD-10-CM | POA: Diagnosis not present

## 2020-09-14 DIAGNOSIS — M6259 Muscle wasting and atrophy, not elsewhere classified, multiple sites: Secondary | ICD-10-CM | POA: Diagnosis not present

## 2020-09-14 DIAGNOSIS — Z8679 Personal history of other diseases of the circulatory system: Secondary | ICD-10-CM | POA: Diagnosis not present

## 2020-09-14 DIAGNOSIS — E785 Hyperlipidemia, unspecified: Secondary | ICD-10-CM | POA: Diagnosis not present

## 2020-09-14 DIAGNOSIS — R2689 Other abnormalities of gait and mobility: Secondary | ICD-10-CM | POA: Diagnosis not present

## 2020-09-14 DIAGNOSIS — I2510 Atherosclerotic heart disease of native coronary artery without angina pectoris: Secondary | ICD-10-CM | POA: Diagnosis not present

## 2020-09-14 DIAGNOSIS — Q271 Congenital renal artery stenosis: Secondary | ICD-10-CM | POA: Diagnosis not present

## 2020-09-14 DIAGNOSIS — N183 Chronic kidney disease, stage 3 unspecified: Secondary | ICD-10-CM | POA: Diagnosis not present

## 2020-09-14 DIAGNOSIS — M6281 Muscle weakness (generalized): Secondary | ICD-10-CM | POA: Diagnosis not present

## 2020-09-14 DIAGNOSIS — C3412 Malignant neoplasm of upper lobe, left bronchus or lung: Secondary | ICD-10-CM | POA: Diagnosis not present

## 2020-09-14 DIAGNOSIS — Z8673 Personal history of transient ischemic attack (TIA), and cerebral infarction without residual deficits: Secondary | ICD-10-CM | POA: Diagnosis not present

## 2020-09-14 DIAGNOSIS — I10 Essential (primary) hypertension: Secondary | ICD-10-CM | POA: Diagnosis not present

## 2020-09-14 DIAGNOSIS — R2681 Unsteadiness on feet: Secondary | ICD-10-CM | POA: Diagnosis not present

## 2020-09-15 ENCOUNTER — Other Ambulatory Visit: Payer: Self-pay

## 2020-09-15 ENCOUNTER — Non-Acute Institutional Stay: Payer: Medicare Other | Admitting: Student

## 2020-09-15 DIAGNOSIS — C3402 Malignant neoplasm of left main bronchus: Secondary | ICD-10-CM | POA: Diagnosis not present

## 2020-09-15 DIAGNOSIS — Z515 Encounter for palliative care: Secondary | ICD-10-CM | POA: Diagnosis not present

## 2020-09-15 NOTE — Progress Notes (Signed)
[facility] Consult Note Telephone: [phone]  Fax: [phone]    Date of encounter: 09/15/20 PATIENT NAME: Henry Allen [place] [address]   [phone] (home)  DOB: 28-Sep-1940 MRN: [id number] PRIMARY CARE PROVIDER:    Dr. Keenan Allen  REFERRING PROVIDER:   Dr. Keenan Allen  RESPONSIBLE PARTY:    Contact Information    Name Relation Home Work Henry  [phone]     Henry Allen Henry Allen [phone]  [phone]       I met face to face with patient in the facility. Henry Allen Henry Allen, via telephone.  Palliative Care was asked to follow this patient by consultation request of  Dr. Keenan Allen to address advance care planning and complex medical decision making. This is a follow up visit.                                   ASSESSMENT AND PLAN / RECOMMENDATIONS:   Advance Care Planning/Goals of Care: Goals include to maximize quality of life and symptom management. Our advance care planning conversation included a discussion about:     The value and importance of advance care planning   Experiences with loved ones who have been seriously ill or have died   Exploration of personal, cultural or spiritual beliefs that might influence medical decisions   Exploration of goals of care in the event of a sudden injury or illness   Identification and preparation of a healthcare agent   Review and updating or creation of an  advance directive document .  Decision not to resuscitate or to de-escalate disease focused treatments due to poor prognosis.  CODE STATUS: DNR  Discussed patient with Henry Allen Henry Allen; explained Palliative Medicine vs. Hospice. We discussed patient missed follow up visit with Oncology. Visits will need to be coordinated through facility as he has difficulty using his phone, checking messages. Henry Allen expressed patient being kept comfortable, not pursuing aggressive treatments. Will  continue to follow patient under Palliative. Will refer patient to hospice services should he meet criteria.   Symptom Management/Plan:  Lung Cancer-patient with shortness of breath secondary to lung CA. Continue oxygen 2lpm prn to keep sats 92% or >, breo ellipta 200-23mg 1 puff daily, combivent respimat 20-1042m 1 puff BID, albuterol Inhaler 2 puffs every 6 hours PRN wheezing/shortness of breath. Staff to monitor for cough, hemoptysis, shortness of breath. NP notified facility SW regarding patient missed f/u Oncology appointment.    Follow up Palliative Care Visit: Palliative care will continue to follow for complex medical decision making, advance care planning, and clarification of goals. Return in 8 weeks or prn.  I spent 35 minutes providing this consultation. More than 50% of the time in this consultation was spent in counseling and care coordination.    PPS: 60%  HOSPICE ELIGIBILITY/DIAGNOSIS: TBD  Chief Complaint: Palliative Medicine follow up visit; Lung cancer.  HISTORY OF PRESENT ILLNESS:  Henry SPEIRs a [age].o. year old male  with Lung Cancer. Patient hospitalized 1/28-1/29/2022 for hemoptysis secondary to Lung cancer. Patient noted to have a 7cm left hilar mass invading into the left mainstem bronchus, suspected primary malignancy. Patient received radiation treatments 2/2-2/15/2022. Diagnoses also include HTN, HLD,CAD s/p CABG,renal artery stenosis, stroke, vascular dementia, history of smoking and abdominal aortic aneurysm.   Patient reports doing well; staff report patient being stable. No hemoptysis. He denies pain, chest pain;  endorses shortness of breath with exertion. He wears oxygen prn during the day and QHS at 2lpm. Uses w/c and walker for locomotion. Fall on 08/27/20 with no apparent injury reported. Good appetite reported. Sleeping well. No recent ER visits or hospitalizations. Patient missed his follow up oncology appointment in March.  History obtained  from review of EMR, discussion with primary team, and interview with family, facility staff/caregiver and/or Henry Allen. I reviewed available labs, medications, imaging, studies and related documents from the EMR.  Records reviewed and summarized above.   ROS  General: NAD EYES: denies vision changes ENMT: denies dysphagia Cardiovascular: denies chest pain, DOE Pulmonary: occasional cough, denies increased SOB Abdomen: endorses good appetite, denies constipation GU: denies dysuria MSK: denies weakness Skin: denies rashes or wounds Neurological: denies pain, denies insomnia Psych: Endorses positive mood Heme/lymph/immuno: denies bruises, abnormal bleeding  Physical Exam: Current weight: 178 pounds on 09/02/20 Pulse 76, resp 16, sats 96% on room air Constitutional: NAD General: WNWD EYES: anicteric sclera, lids intact, no discharge  ENMT: intact hearing, oral mucous membranes moist, dentition intact CV: S1S2, RRR, no LE edema Pulmonary: LCTA, no increased work of breathing, no cough, room air Abdomen: rotund abdomen, normo-active BS + 4 quadrants GU: deferred MSK: no sarcopenia, moves all extremities, ambulatory Skin: warm and dry, no rashes or wounds on visible skin Neuro:  no generalized weakness Psych: non-anxious affect, A & O x 2, forgetful Hem/lymph/immuno: no widespread bruising   Thank you for the opportunity to participate in the care of Henry Allen.  The palliative care team will continue to follow. Please call our office at [phone] if we can be of additional assistance.   Henry Slocumb, NP   COVID-19 PATIENT SCREENING TOOL Asked and negative response unless otherwise noted:   Have you had symptoms of covid, tested positive or been in contact with someone with symptoms/positive test in the past 5-10 days? No

## 2020-10-01 DIAGNOSIS — J439 Emphysema, unspecified: Secondary | ICD-10-CM | POA: Diagnosis not present

## 2020-10-01 DIAGNOSIS — R0989 Other specified symptoms and signs involving the circulatory and respiratory systems: Secondary | ICD-10-CM | POA: Diagnosis not present

## 2020-10-01 DIAGNOSIS — C3490 Malignant neoplasm of unspecified part of unspecified bronchus or lung: Secondary | ICD-10-CM | POA: Diagnosis not present

## 2020-11-10 DIAGNOSIS — C3490 Malignant neoplasm of unspecified part of unspecified bronchus or lung: Secondary | ICD-10-CM | POA: Diagnosis not present

## 2020-11-10 DIAGNOSIS — I2510 Atherosclerotic heart disease of native coronary artery without angina pectoris: Secondary | ICD-10-CM | POA: Diagnosis not present

## 2020-11-10 DIAGNOSIS — K439 Ventral hernia without obstruction or gangrene: Secondary | ICD-10-CM | POA: Diagnosis not present

## 2020-11-10 DIAGNOSIS — I679 Cerebrovascular disease, unspecified: Secondary | ICD-10-CM | POA: Diagnosis not present

## 2020-11-10 DIAGNOSIS — J9611 Chronic respiratory failure with hypoxia: Secondary | ICD-10-CM | POA: Diagnosis not present

## 2020-11-10 DIAGNOSIS — I150 Renovascular hypertension: Secondary | ICD-10-CM | POA: Diagnosis not present

## 2020-11-10 DIAGNOSIS — M791 Myalgia, unspecified site: Secondary | ICD-10-CM | POA: Diagnosis not present

## 2020-11-12 DIAGNOSIS — C3490 Malignant neoplasm of unspecified part of unspecified bronchus or lung: Secondary | ICD-10-CM | POA: Diagnosis not present

## 2020-11-12 DIAGNOSIS — J984 Other disorders of lung: Secondary | ICD-10-CM | POA: Diagnosis not present

## 2020-11-12 DIAGNOSIS — M791 Myalgia, unspecified site: Secondary | ICD-10-CM | POA: Diagnosis not present

## 2020-11-12 DIAGNOSIS — I150 Renovascular hypertension: Secondary | ICD-10-CM | POA: Diagnosis not present

## 2020-11-13 DIAGNOSIS — M545 Low back pain, unspecified: Secondary | ICD-10-CM | POA: Diagnosis not present

## 2020-11-13 DIAGNOSIS — M546 Pain in thoracic spine: Secondary | ICD-10-CM | POA: Diagnosis not present

## 2020-11-13 DIAGNOSIS — R079 Chest pain, unspecified: Secondary | ICD-10-CM | POA: Diagnosis not present

## 2020-11-16 DIAGNOSIS — M546 Pain in thoracic spine: Secondary | ICD-10-CM | POA: Diagnosis not present

## 2020-11-16 DIAGNOSIS — R918 Other nonspecific abnormal finding of lung field: Secondary | ICD-10-CM | POA: Diagnosis not present

## 2020-11-16 DIAGNOSIS — J918 Pleural effusion in other conditions classified elsewhere: Secondary | ICD-10-CM | POA: Diagnosis not present

## 2020-11-16 DIAGNOSIS — J811 Chronic pulmonary edema: Secondary | ICD-10-CM | POA: Diagnosis not present

## 2020-11-16 DIAGNOSIS — M545 Low back pain, unspecified: Secondary | ICD-10-CM | POA: Diagnosis not present

## 2020-11-18 ENCOUNTER — Other Ambulatory Visit: Payer: Self-pay

## 2020-11-18 ENCOUNTER — Ambulatory Visit
Admission: RE | Admit: 2020-11-18 | Discharge: 2020-11-18 | Disposition: A | Discharge status: Home / Self Care | Payer: Medicare Other | Source: Ambulatory Visit | Attending: Urology | Admitting: Urology

## 2020-11-18 ENCOUNTER — Telehealth: Payer: Self-pay | Admitting: Urology

## 2020-11-18 DIAGNOSIS — R042 Hemoptysis: Secondary | ICD-10-CM

## 2020-11-18 DIAGNOSIS — C3402 Malignant neoplasm of left main bronchus: Secondary | ICD-10-CM

## 2020-11-18 NOTE — Progress Notes (Signed)
Mr Maull's daughter Olegario Shearer  called and stated that her dad was doing well since treatment and that he was not having any hemoptysis since treatment. States that she does not have any other concerns at this time. Informed her that if she has any questions to call us back.

## 2020-11-18 NOTE — Progress Notes (Signed)
Called Henry Allen to do meaningful use for his one month follow-up appointment with Henry Allen this morning at 1030. Patient did not answer the telephone contacted his daughter Esmond Camper she states that the patient is at [facility] and that I needed  to call there. Called [facility] and spoke with the DON Vicky . She went over patients medication and states that Henry can call the main line and they will connect her to [name] at 1030.

## 2020-11-18 NOTE — Telephone Encounter (Signed)
Again, unable to reach the patient or his daughter, Jocelyn Lamer, to conduct the 1 month follow-up visit (now almost 5 months s/p completion of palliative XRT for hemoptysis) by telephone as scheduled. The patient is being followed by palliative care at [facility] and despite multiple attempts to be placed into contact with the patient, each time they transferred me to the patient's room the telephone was not answered. I did leave another message with his daughter, Loletha Carrow who also did not answer my call, advising them to call back if they have any questions or concerns related to Mr. Teuscher recent radiotherapy.  Nicholos Johns, MMS, PA-C [facility] [place]: [phone]  Fax: [phone]

## 2020-11-19 DIAGNOSIS — I2510 Atherosclerotic heart disease of native coronary artery without angina pectoris: Secondary | ICD-10-CM | POA: Diagnosis not present

## 2020-11-19 DIAGNOSIS — E559 Vitamin D deficiency, unspecified: Secondary | ICD-10-CM | POA: Diagnosis not present

## 2020-11-19 DIAGNOSIS — D649 Anemia, unspecified: Secondary | ICD-10-CM | POA: Diagnosis not present

## 2020-11-19 DIAGNOSIS — E0789 Other specified disorders of thyroid: Secondary | ICD-10-CM | POA: Diagnosis not present

## 2020-11-19 DIAGNOSIS — N183 Chronic kidney disease, stage 3 unspecified: Secondary | ICD-10-CM | POA: Diagnosis not present

## 2020-11-24 DIAGNOSIS — S0181XA Laceration without foreign body of other part of head, initial encounter: Secondary | ICD-10-CM | POA: Diagnosis not present

## 2020-11-24 DIAGNOSIS — W1789XA Other fall from one level to another, initial encounter: Secondary | ICD-10-CM | POA: Diagnosis not present

## 2020-11-24 DIAGNOSIS — R748 Abnormal levels of other serum enzymes: Secondary | ICD-10-CM | POA: Diagnosis not present

## 2020-11-24 DIAGNOSIS — E568 Deficiency of other vitamins: Secondary | ICD-10-CM | POA: Diagnosis not present

## 2020-11-28 DIAGNOSIS — N183 Chronic kidney disease, stage 3 unspecified: Secondary | ICD-10-CM | POA: Diagnosis not present

## 2020-11-28 DIAGNOSIS — R1312 Dysphagia, oropharyngeal phase: Secondary | ICD-10-CM | POA: Diagnosis not present

## 2020-11-28 DIAGNOSIS — M6259 Muscle wasting and atrophy, not elsewhere classified, multiple sites: Secondary | ICD-10-CM | POA: Diagnosis not present

## 2020-11-28 DIAGNOSIS — I2510 Atherosclerotic heart disease of native coronary artery without angina pectoris: Secondary | ICD-10-CM | POA: Diagnosis not present

## 2020-11-28 DIAGNOSIS — Z8673 Personal history of transient ischemic attack (TIA), and cerebral infarction without residual deficits: Secondary | ICD-10-CM | POA: Diagnosis not present

## 2020-11-28 DIAGNOSIS — Q271 Congenital renal artery stenosis: Secondary | ICD-10-CM | POA: Diagnosis not present

## 2020-11-28 DIAGNOSIS — E785 Hyperlipidemia, unspecified: Secondary | ICD-10-CM | POA: Diagnosis not present

## 2020-11-28 DIAGNOSIS — Z8679 Personal history of other diseases of the circulatory system: Secondary | ICD-10-CM | POA: Diagnosis not present

## 2020-11-28 DIAGNOSIS — I10 Essential (primary) hypertension: Secondary | ICD-10-CM | POA: Diagnosis not present

## 2020-11-28 DIAGNOSIS — M6281 Muscle weakness (generalized): Secondary | ICD-10-CM | POA: Diagnosis not present

## 2020-11-28 DIAGNOSIS — C3412 Malignant neoplasm of upper lobe, left bronchus or lung: Secondary | ICD-10-CM | POA: Diagnosis not present

## 2020-11-28 DIAGNOSIS — R2681 Unsteadiness on feet: Secondary | ICD-10-CM | POA: Diagnosis not present

## 2020-11-28 DIAGNOSIS — R2689 Other abnormalities of gait and mobility: Secondary | ICD-10-CM | POA: Diagnosis not present

## 2020-11-30 DIAGNOSIS — M6259 Muscle wasting and atrophy, not elsewhere classified, multiple sites: Secondary | ICD-10-CM | POA: Diagnosis not present

## 2020-11-30 DIAGNOSIS — E785 Hyperlipidemia, unspecified: Secondary | ICD-10-CM | POA: Diagnosis not present

## 2020-11-30 DIAGNOSIS — R1312 Dysphagia, oropharyngeal phase: Secondary | ICD-10-CM | POA: Diagnosis not present

## 2020-11-30 DIAGNOSIS — Z8673 Personal history of transient ischemic attack (TIA), and cerebral infarction without residual deficits: Secondary | ICD-10-CM | POA: Diagnosis not present

## 2020-11-30 DIAGNOSIS — R2681 Unsteadiness on feet: Secondary | ICD-10-CM | POA: Diagnosis not present

## 2020-11-30 DIAGNOSIS — Z8679 Personal history of other diseases of the circulatory system: Secondary | ICD-10-CM | POA: Diagnosis not present

## 2020-11-30 DIAGNOSIS — I2510 Atherosclerotic heart disease of native coronary artery without angina pectoris: Secondary | ICD-10-CM | POA: Diagnosis not present

## 2020-11-30 DIAGNOSIS — M6281 Muscle weakness (generalized): Secondary | ICD-10-CM | POA: Diagnosis not present

## 2020-11-30 DIAGNOSIS — I10 Essential (primary) hypertension: Secondary | ICD-10-CM | POA: Diagnosis not present

## 2020-11-30 DIAGNOSIS — C3412 Malignant neoplasm of upper lobe, left bronchus or lung: Secondary | ICD-10-CM | POA: Diagnosis not present

## 2020-11-30 DIAGNOSIS — R2689 Other abnormalities of gait and mobility: Secondary | ICD-10-CM | POA: Diagnosis not present

## 2020-11-30 DIAGNOSIS — Q271 Congenital renal artery stenosis: Secondary | ICD-10-CM | POA: Diagnosis not present

## 2020-11-30 DIAGNOSIS — N183 Chronic kidney disease, stage 3 unspecified: Secondary | ICD-10-CM | POA: Diagnosis not present

## 2020-12-01 DIAGNOSIS — R2681 Unsteadiness on feet: Secondary | ICD-10-CM | POA: Diagnosis not present

## 2020-12-01 DIAGNOSIS — N183 Chronic kidney disease, stage 3 unspecified: Secondary | ICD-10-CM | POA: Diagnosis not present

## 2020-12-01 DIAGNOSIS — I10 Essential (primary) hypertension: Secondary | ICD-10-CM | POA: Diagnosis not present

## 2020-12-01 DIAGNOSIS — R1312 Dysphagia, oropharyngeal phase: Secondary | ICD-10-CM | POA: Diagnosis not present

## 2020-12-01 DIAGNOSIS — Z8679 Personal history of other diseases of the circulatory system: Secondary | ICD-10-CM | POA: Diagnosis not present

## 2020-12-01 DIAGNOSIS — M6259 Muscle wasting and atrophy, not elsewhere classified, multiple sites: Secondary | ICD-10-CM | POA: Diagnosis not present

## 2020-12-01 DIAGNOSIS — M6281 Muscle weakness (generalized): Secondary | ICD-10-CM | POA: Diagnosis not present

## 2020-12-01 DIAGNOSIS — C3412 Malignant neoplasm of upper lobe, left bronchus or lung: Secondary | ICD-10-CM | POA: Diagnosis not present

## 2020-12-01 DIAGNOSIS — Q271 Congenital renal artery stenosis: Secondary | ICD-10-CM | POA: Diagnosis not present

## 2020-12-01 DIAGNOSIS — Z8673 Personal history of transient ischemic attack (TIA), and cerebral infarction without residual deficits: Secondary | ICD-10-CM | POA: Diagnosis not present

## 2020-12-01 DIAGNOSIS — I2510 Atherosclerotic heart disease of native coronary artery without angina pectoris: Secondary | ICD-10-CM | POA: Diagnosis not present

## 2020-12-01 DIAGNOSIS — E785 Hyperlipidemia, unspecified: Secondary | ICD-10-CM | POA: Diagnosis not present

## 2020-12-01 DIAGNOSIS — R2689 Other abnormalities of gait and mobility: Secondary | ICD-10-CM | POA: Diagnosis not present

## 2020-12-02 DIAGNOSIS — Q271 Congenital renal artery stenosis: Secondary | ICD-10-CM | POA: Diagnosis not present

## 2020-12-02 DIAGNOSIS — I2510 Atherosclerotic heart disease of native coronary artery without angina pectoris: Secondary | ICD-10-CM | POA: Diagnosis not present

## 2020-12-02 DIAGNOSIS — N183 Chronic kidney disease, stage 3 unspecified: Secondary | ICD-10-CM | POA: Diagnosis not present

## 2020-12-02 DIAGNOSIS — R2681 Unsteadiness on feet: Secondary | ICD-10-CM | POA: Diagnosis not present

## 2020-12-02 DIAGNOSIS — E785 Hyperlipidemia, unspecified: Secondary | ICD-10-CM | POA: Diagnosis not present

## 2020-12-02 DIAGNOSIS — Z8679 Personal history of other diseases of the circulatory system: Secondary | ICD-10-CM | POA: Diagnosis not present

## 2020-12-02 DIAGNOSIS — I10 Essential (primary) hypertension: Secondary | ICD-10-CM | POA: Diagnosis not present

## 2020-12-02 DIAGNOSIS — R1312 Dysphagia, oropharyngeal phase: Secondary | ICD-10-CM | POA: Diagnosis not present

## 2020-12-02 DIAGNOSIS — M6259 Muscle wasting and atrophy, not elsewhere classified, multiple sites: Secondary | ICD-10-CM | POA: Diagnosis not present

## 2020-12-02 DIAGNOSIS — R2689 Other abnormalities of gait and mobility: Secondary | ICD-10-CM | POA: Diagnosis not present

## 2020-12-02 DIAGNOSIS — C3412 Malignant neoplasm of upper lobe, left bronchus or lung: Secondary | ICD-10-CM | POA: Diagnosis not present

## 2020-12-02 DIAGNOSIS — M6281 Muscle weakness (generalized): Secondary | ICD-10-CM | POA: Diagnosis not present

## 2020-12-02 DIAGNOSIS — Z8673 Personal history of transient ischemic attack (TIA), and cerebral infarction without residual deficits: Secondary | ICD-10-CM | POA: Diagnosis not present

## 2020-12-03 DIAGNOSIS — R1312 Dysphagia, oropharyngeal phase: Secondary | ICD-10-CM | POA: Diagnosis not present

## 2020-12-03 DIAGNOSIS — M6259 Muscle wasting and atrophy, not elsewhere classified, multiple sites: Secondary | ICD-10-CM | POA: Diagnosis not present

## 2020-12-03 DIAGNOSIS — I2510 Atherosclerotic heart disease of native coronary artery without angina pectoris: Secondary | ICD-10-CM | POA: Diagnosis not present

## 2020-12-03 DIAGNOSIS — I10 Essential (primary) hypertension: Secondary | ICD-10-CM | POA: Diagnosis not present

## 2020-12-03 DIAGNOSIS — M6281 Muscle weakness (generalized): Secondary | ICD-10-CM | POA: Diagnosis not present

## 2020-12-03 DIAGNOSIS — E785 Hyperlipidemia, unspecified: Secondary | ICD-10-CM | POA: Diagnosis not present

## 2020-12-03 DIAGNOSIS — Z8679 Personal history of other diseases of the circulatory system: Secondary | ICD-10-CM | POA: Diagnosis not present

## 2020-12-03 DIAGNOSIS — N183 Chronic kidney disease, stage 3 unspecified: Secondary | ICD-10-CM | POA: Diagnosis not present

## 2020-12-03 DIAGNOSIS — C3412 Malignant neoplasm of upper lobe, left bronchus or lung: Secondary | ICD-10-CM | POA: Diagnosis not present

## 2020-12-03 DIAGNOSIS — R2681 Unsteadiness on feet: Secondary | ICD-10-CM | POA: Diagnosis not present

## 2020-12-03 DIAGNOSIS — Q271 Congenital renal artery stenosis: Secondary | ICD-10-CM | POA: Diagnosis not present

## 2020-12-03 DIAGNOSIS — R2689 Other abnormalities of gait and mobility: Secondary | ICD-10-CM | POA: Diagnosis not present

## 2020-12-03 DIAGNOSIS — Z8673 Personal history of transient ischemic attack (TIA), and cerebral infarction without residual deficits: Secondary | ICD-10-CM | POA: Diagnosis not present

## 2020-12-04 DIAGNOSIS — M6281 Muscle weakness (generalized): Secondary | ICD-10-CM | POA: Diagnosis not present

## 2020-12-04 DIAGNOSIS — R2681 Unsteadiness on feet: Secondary | ICD-10-CM | POA: Diagnosis not present

## 2020-12-04 DIAGNOSIS — C3412 Malignant neoplasm of upper lobe, left bronchus or lung: Secondary | ICD-10-CM | POA: Diagnosis not present

## 2020-12-04 DIAGNOSIS — Q271 Congenital renal artery stenosis: Secondary | ICD-10-CM | POA: Diagnosis not present

## 2020-12-04 DIAGNOSIS — Z8673 Personal history of transient ischemic attack (TIA), and cerebral infarction without residual deficits: Secondary | ICD-10-CM | POA: Diagnosis not present

## 2020-12-04 DIAGNOSIS — M6259 Muscle wasting and atrophy, not elsewhere classified, multiple sites: Secondary | ICD-10-CM | POA: Diagnosis not present

## 2020-12-04 DIAGNOSIS — R1312 Dysphagia, oropharyngeal phase: Secondary | ICD-10-CM | POA: Diagnosis not present

## 2020-12-04 DIAGNOSIS — E785 Hyperlipidemia, unspecified: Secondary | ICD-10-CM | POA: Diagnosis not present

## 2020-12-04 DIAGNOSIS — N183 Chronic kidney disease, stage 3 unspecified: Secondary | ICD-10-CM | POA: Diagnosis not present

## 2020-12-04 DIAGNOSIS — Z8679 Personal history of other diseases of the circulatory system: Secondary | ICD-10-CM | POA: Diagnosis not present

## 2020-12-04 DIAGNOSIS — I10 Essential (primary) hypertension: Secondary | ICD-10-CM | POA: Diagnosis not present

## 2020-12-04 DIAGNOSIS — I2510 Atherosclerotic heart disease of native coronary artery without angina pectoris: Secondary | ICD-10-CM | POA: Diagnosis not present

## 2020-12-04 DIAGNOSIS — R2689 Other abnormalities of gait and mobility: Secondary | ICD-10-CM | POA: Diagnosis not present

## 2020-12-07 DIAGNOSIS — E785 Hyperlipidemia, unspecified: Secondary | ICD-10-CM | POA: Diagnosis not present

## 2020-12-07 DIAGNOSIS — M6281 Muscle weakness (generalized): Secondary | ICD-10-CM | POA: Diagnosis not present

## 2020-12-07 DIAGNOSIS — I2510 Atherosclerotic heart disease of native coronary artery without angina pectoris: Secondary | ICD-10-CM | POA: Diagnosis not present

## 2020-12-07 DIAGNOSIS — N183 Chronic kidney disease, stage 3 unspecified: Secondary | ICD-10-CM | POA: Diagnosis not present

## 2020-12-07 DIAGNOSIS — R1312 Dysphagia, oropharyngeal phase: Secondary | ICD-10-CM | POA: Diagnosis not present

## 2020-12-07 DIAGNOSIS — R2689 Other abnormalities of gait and mobility: Secondary | ICD-10-CM | POA: Diagnosis not present

## 2020-12-07 DIAGNOSIS — I10 Essential (primary) hypertension: Secondary | ICD-10-CM | POA: Diagnosis not present

## 2020-12-07 DIAGNOSIS — Z8679 Personal history of other diseases of the circulatory system: Secondary | ICD-10-CM | POA: Diagnosis not present

## 2020-12-07 DIAGNOSIS — M6259 Muscle wasting and atrophy, not elsewhere classified, multiple sites: Secondary | ICD-10-CM | POA: Diagnosis not present

## 2020-12-07 DIAGNOSIS — C3412 Malignant neoplasm of upper lobe, left bronchus or lung: Secondary | ICD-10-CM | POA: Diagnosis not present

## 2020-12-07 DIAGNOSIS — Q271 Congenital renal artery stenosis: Secondary | ICD-10-CM | POA: Diagnosis not present

## 2020-12-07 DIAGNOSIS — R2681 Unsteadiness on feet: Secondary | ICD-10-CM | POA: Diagnosis not present

## 2020-12-07 DIAGNOSIS — Z8673 Personal history of transient ischemic attack (TIA), and cerebral infarction without residual deficits: Secondary | ICD-10-CM | POA: Diagnosis not present

## 2020-12-08 DIAGNOSIS — E785 Hyperlipidemia, unspecified: Secondary | ICD-10-CM | POA: Diagnosis not present

## 2020-12-08 DIAGNOSIS — M6259 Muscle wasting and atrophy, not elsewhere classified, multiple sites: Secondary | ICD-10-CM | POA: Diagnosis not present

## 2020-12-08 DIAGNOSIS — Z8673 Personal history of transient ischemic attack (TIA), and cerebral infarction without residual deficits: Secondary | ICD-10-CM | POA: Diagnosis not present

## 2020-12-08 DIAGNOSIS — Z8679 Personal history of other diseases of the circulatory system: Secondary | ICD-10-CM | POA: Diagnosis not present

## 2020-12-08 DIAGNOSIS — R2689 Other abnormalities of gait and mobility: Secondary | ICD-10-CM | POA: Diagnosis not present

## 2020-12-08 DIAGNOSIS — I10 Essential (primary) hypertension: Secondary | ICD-10-CM | POA: Diagnosis not present

## 2020-12-08 DIAGNOSIS — R2681 Unsteadiness on feet: Secondary | ICD-10-CM | POA: Diagnosis not present

## 2020-12-08 DIAGNOSIS — N183 Chronic kidney disease, stage 3 unspecified: Secondary | ICD-10-CM | POA: Diagnosis not present

## 2020-12-08 DIAGNOSIS — M6281 Muscle weakness (generalized): Secondary | ICD-10-CM | POA: Diagnosis not present

## 2020-12-08 DIAGNOSIS — Q271 Congenital renal artery stenosis: Secondary | ICD-10-CM | POA: Diagnosis not present

## 2020-12-08 DIAGNOSIS — R1312 Dysphagia, oropharyngeal phase: Secondary | ICD-10-CM | POA: Diagnosis not present

## 2020-12-08 DIAGNOSIS — I2510 Atherosclerotic heart disease of native coronary artery without angina pectoris: Secondary | ICD-10-CM | POA: Diagnosis not present

## 2020-12-08 DIAGNOSIS — C3412 Malignant neoplasm of upper lobe, left bronchus or lung: Secondary | ICD-10-CM | POA: Diagnosis not present

## 2020-12-09 DIAGNOSIS — R1312 Dysphagia, oropharyngeal phase: Secondary | ICD-10-CM | POA: Diagnosis not present

## 2020-12-09 DIAGNOSIS — M6281 Muscle weakness (generalized): Secondary | ICD-10-CM | POA: Diagnosis not present

## 2020-12-09 DIAGNOSIS — R2681 Unsteadiness on feet: Secondary | ICD-10-CM | POA: Diagnosis not present

## 2020-12-09 DIAGNOSIS — R2689 Other abnormalities of gait and mobility: Secondary | ICD-10-CM | POA: Diagnosis not present

## 2020-12-09 DIAGNOSIS — Q271 Congenital renal artery stenosis: Secondary | ICD-10-CM | POA: Diagnosis not present

## 2020-12-09 DIAGNOSIS — E785 Hyperlipidemia, unspecified: Secondary | ICD-10-CM | POA: Diagnosis not present

## 2020-12-09 DIAGNOSIS — M6259 Muscle wasting and atrophy, not elsewhere classified, multiple sites: Secondary | ICD-10-CM | POA: Diagnosis not present

## 2020-12-09 DIAGNOSIS — N183 Chronic kidney disease, stage 3 unspecified: Secondary | ICD-10-CM | POA: Diagnosis not present

## 2020-12-09 DIAGNOSIS — C3412 Malignant neoplasm of upper lobe, left bronchus or lung: Secondary | ICD-10-CM | POA: Diagnosis not present

## 2020-12-09 DIAGNOSIS — I10 Essential (primary) hypertension: Secondary | ICD-10-CM | POA: Diagnosis not present

## 2020-12-09 DIAGNOSIS — Z8673 Personal history of transient ischemic attack (TIA), and cerebral infarction without residual deficits: Secondary | ICD-10-CM | POA: Diagnosis not present

## 2020-12-09 DIAGNOSIS — I2510 Atherosclerotic heart disease of native coronary artery without angina pectoris: Secondary | ICD-10-CM | POA: Diagnosis not present

## 2020-12-09 DIAGNOSIS — Z8679 Personal history of other diseases of the circulatory system: Secondary | ICD-10-CM | POA: Diagnosis not present

## 2020-12-10 DIAGNOSIS — R2689 Other abnormalities of gait and mobility: Secondary | ICD-10-CM | POA: Diagnosis not present

## 2020-12-10 DIAGNOSIS — C3412 Malignant neoplasm of upper lobe, left bronchus or lung: Secondary | ICD-10-CM | POA: Diagnosis not present

## 2020-12-10 DIAGNOSIS — I10 Essential (primary) hypertension: Secondary | ICD-10-CM | POA: Diagnosis not present

## 2020-12-10 DIAGNOSIS — Z8679 Personal history of other diseases of the circulatory system: Secondary | ICD-10-CM | POA: Diagnosis not present

## 2020-12-10 DIAGNOSIS — N183 Chronic kidney disease, stage 3 unspecified: Secondary | ICD-10-CM | POA: Diagnosis not present

## 2020-12-10 DIAGNOSIS — I2510 Atherosclerotic heart disease of native coronary artery without angina pectoris: Secondary | ICD-10-CM | POA: Diagnosis not present

## 2020-12-10 DIAGNOSIS — R2681 Unsteadiness on feet: Secondary | ICD-10-CM | POA: Diagnosis not present

## 2020-12-10 DIAGNOSIS — Z8673 Personal history of transient ischemic attack (TIA), and cerebral infarction without residual deficits: Secondary | ICD-10-CM | POA: Diagnosis not present

## 2020-12-10 DIAGNOSIS — M6281 Muscle weakness (generalized): Secondary | ICD-10-CM | POA: Diagnosis not present

## 2020-12-10 DIAGNOSIS — M6259 Muscle wasting and atrophy, not elsewhere classified, multiple sites: Secondary | ICD-10-CM | POA: Diagnosis not present

## 2020-12-10 DIAGNOSIS — Q271 Congenital renal artery stenosis: Secondary | ICD-10-CM | POA: Diagnosis not present

## 2020-12-10 DIAGNOSIS — R1312 Dysphagia, oropharyngeal phase: Secondary | ICD-10-CM | POA: Diagnosis not present

## 2020-12-10 DIAGNOSIS — E785 Hyperlipidemia, unspecified: Secondary | ICD-10-CM | POA: Diagnosis not present

## 2020-12-11 DIAGNOSIS — R2689 Other abnormalities of gait and mobility: Secondary | ICD-10-CM | POA: Diagnosis not present

## 2020-12-11 DIAGNOSIS — R1312 Dysphagia, oropharyngeal phase: Secondary | ICD-10-CM | POA: Diagnosis not present

## 2020-12-11 DIAGNOSIS — Q271 Congenital renal artery stenosis: Secondary | ICD-10-CM | POA: Diagnosis not present

## 2020-12-11 DIAGNOSIS — N183 Chronic kidney disease, stage 3 unspecified: Secondary | ICD-10-CM | POA: Diagnosis not present

## 2020-12-11 DIAGNOSIS — R2681 Unsteadiness on feet: Secondary | ICD-10-CM | POA: Diagnosis not present

## 2020-12-11 DIAGNOSIS — I10 Essential (primary) hypertension: Secondary | ICD-10-CM | POA: Diagnosis not present

## 2020-12-11 DIAGNOSIS — Z8673 Personal history of transient ischemic attack (TIA), and cerebral infarction without residual deficits: Secondary | ICD-10-CM | POA: Diagnosis not present

## 2020-12-11 DIAGNOSIS — Z8679 Personal history of other diseases of the circulatory system: Secondary | ICD-10-CM | POA: Diagnosis not present

## 2020-12-11 DIAGNOSIS — M6259 Muscle wasting and atrophy, not elsewhere classified, multiple sites: Secondary | ICD-10-CM | POA: Diagnosis not present

## 2020-12-11 DIAGNOSIS — C3412 Malignant neoplasm of upper lobe, left bronchus or lung: Secondary | ICD-10-CM | POA: Diagnosis not present

## 2020-12-11 DIAGNOSIS — I2510 Atherosclerotic heart disease of native coronary artery without angina pectoris: Secondary | ICD-10-CM | POA: Diagnosis not present

## 2020-12-11 DIAGNOSIS — E785 Hyperlipidemia, unspecified: Secondary | ICD-10-CM | POA: Diagnosis not present

## 2020-12-11 DIAGNOSIS — M6281 Muscle weakness (generalized): Secondary | ICD-10-CM | POA: Diagnosis not present

## 2020-12-14 DIAGNOSIS — Z8673 Personal history of transient ischemic attack (TIA), and cerebral infarction without residual deficits: Secondary | ICD-10-CM | POA: Diagnosis not present

## 2020-12-14 DIAGNOSIS — Q271 Congenital renal artery stenosis: Secondary | ICD-10-CM | POA: Diagnosis not present

## 2020-12-14 DIAGNOSIS — R1312 Dysphagia, oropharyngeal phase: Secondary | ICD-10-CM | POA: Diagnosis not present

## 2020-12-14 DIAGNOSIS — Z8679 Personal history of other diseases of the circulatory system: Secondary | ICD-10-CM | POA: Diagnosis not present

## 2020-12-14 DIAGNOSIS — R2689 Other abnormalities of gait and mobility: Secondary | ICD-10-CM | POA: Diagnosis not present

## 2020-12-14 DIAGNOSIS — E785 Hyperlipidemia, unspecified: Secondary | ICD-10-CM | POA: Diagnosis not present

## 2020-12-14 DIAGNOSIS — M6281 Muscle weakness (generalized): Secondary | ICD-10-CM | POA: Diagnosis not present

## 2020-12-14 DIAGNOSIS — I2510 Atherosclerotic heart disease of native coronary artery without angina pectoris: Secondary | ICD-10-CM | POA: Diagnosis not present

## 2020-12-14 DIAGNOSIS — N183 Chronic kidney disease, stage 3 unspecified: Secondary | ICD-10-CM | POA: Diagnosis not present

## 2020-12-14 DIAGNOSIS — C3412 Malignant neoplasm of upper lobe, left bronchus or lung: Secondary | ICD-10-CM | POA: Diagnosis not present

## 2020-12-14 DIAGNOSIS — I10 Essential (primary) hypertension: Secondary | ICD-10-CM | POA: Diagnosis not present

## 2020-12-14 DIAGNOSIS — R2681 Unsteadiness on feet: Secondary | ICD-10-CM | POA: Diagnosis not present

## 2020-12-14 DIAGNOSIS — M6259 Muscle wasting and atrophy, not elsewhere classified, multiple sites: Secondary | ICD-10-CM | POA: Diagnosis not present

## 2020-12-15 DIAGNOSIS — I10 Essential (primary) hypertension: Secondary | ICD-10-CM | POA: Diagnosis not present

## 2020-12-15 DIAGNOSIS — Z8673 Personal history of transient ischemic attack (TIA), and cerebral infarction without residual deficits: Secondary | ICD-10-CM | POA: Diagnosis not present

## 2020-12-15 DIAGNOSIS — E785 Hyperlipidemia, unspecified: Secondary | ICD-10-CM | POA: Diagnosis not present

## 2020-12-15 DIAGNOSIS — Z8679 Personal history of other diseases of the circulatory system: Secondary | ICD-10-CM | POA: Diagnosis not present

## 2020-12-15 DIAGNOSIS — N183 Chronic kidney disease, stage 3 unspecified: Secondary | ICD-10-CM | POA: Diagnosis not present

## 2020-12-15 DIAGNOSIS — C3412 Malignant neoplasm of upper lobe, left bronchus or lung: Secondary | ICD-10-CM | POA: Diagnosis not present

## 2020-12-15 DIAGNOSIS — R1312 Dysphagia, oropharyngeal phase: Secondary | ICD-10-CM | POA: Diagnosis not present

## 2020-12-15 DIAGNOSIS — M6259 Muscle wasting and atrophy, not elsewhere classified, multiple sites: Secondary | ICD-10-CM | POA: Diagnosis not present

## 2020-12-15 DIAGNOSIS — R2689 Other abnormalities of gait and mobility: Secondary | ICD-10-CM | POA: Diagnosis not present

## 2020-12-15 DIAGNOSIS — M6281 Muscle weakness (generalized): Secondary | ICD-10-CM | POA: Diagnosis not present

## 2020-12-15 DIAGNOSIS — Q271 Congenital renal artery stenosis: Secondary | ICD-10-CM | POA: Diagnosis not present

## 2020-12-15 DIAGNOSIS — R2681 Unsteadiness on feet: Secondary | ICD-10-CM | POA: Diagnosis not present

## 2020-12-15 DIAGNOSIS — I2510 Atherosclerotic heart disease of native coronary artery without angina pectoris: Secondary | ICD-10-CM | POA: Diagnosis not present

## 2020-12-16 DIAGNOSIS — R2681 Unsteadiness on feet: Secondary | ICD-10-CM | POA: Diagnosis not present

## 2020-12-16 DIAGNOSIS — R1312 Dysphagia, oropharyngeal phase: Secondary | ICD-10-CM | POA: Diagnosis not present

## 2020-12-16 DIAGNOSIS — Z8673 Personal history of transient ischemic attack (TIA), and cerebral infarction without residual deficits: Secondary | ICD-10-CM | POA: Diagnosis not present

## 2020-12-16 DIAGNOSIS — Q271 Congenital renal artery stenosis: Secondary | ICD-10-CM | POA: Diagnosis not present

## 2020-12-16 DIAGNOSIS — C3412 Malignant neoplasm of upper lobe, left bronchus or lung: Secondary | ICD-10-CM | POA: Diagnosis not present

## 2020-12-16 DIAGNOSIS — I2510 Atherosclerotic heart disease of native coronary artery without angina pectoris: Secondary | ICD-10-CM | POA: Diagnosis not present

## 2020-12-16 DIAGNOSIS — M6281 Muscle weakness (generalized): Secondary | ICD-10-CM | POA: Diagnosis not present

## 2020-12-16 DIAGNOSIS — Z8679 Personal history of other diseases of the circulatory system: Secondary | ICD-10-CM | POA: Diagnosis not present

## 2020-12-16 DIAGNOSIS — R2689 Other abnormalities of gait and mobility: Secondary | ICD-10-CM | POA: Diagnosis not present

## 2020-12-16 DIAGNOSIS — M6259 Muscle wasting and atrophy, not elsewhere classified, multiple sites: Secondary | ICD-10-CM | POA: Diagnosis not present

## 2020-12-16 DIAGNOSIS — E785 Hyperlipidemia, unspecified: Secondary | ICD-10-CM | POA: Diagnosis not present

## 2020-12-16 DIAGNOSIS — I10 Essential (primary) hypertension: Secondary | ICD-10-CM | POA: Diagnosis not present

## 2020-12-16 DIAGNOSIS — N183 Chronic kidney disease, stage 3 unspecified: Secondary | ICD-10-CM | POA: Diagnosis not present

## 2020-12-17 DIAGNOSIS — R2681 Unsteadiness on feet: Secondary | ICD-10-CM | POA: Diagnosis not present

## 2020-12-17 DIAGNOSIS — C3412 Malignant neoplasm of upper lobe, left bronchus or lung: Secondary | ICD-10-CM | POA: Diagnosis not present

## 2020-12-17 DIAGNOSIS — Z8679 Personal history of other diseases of the circulatory system: Secondary | ICD-10-CM | POA: Diagnosis not present

## 2020-12-17 DIAGNOSIS — I10 Essential (primary) hypertension: Secondary | ICD-10-CM | POA: Diagnosis not present

## 2020-12-17 DIAGNOSIS — R2689 Other abnormalities of gait and mobility: Secondary | ICD-10-CM | POA: Diagnosis not present

## 2020-12-17 DIAGNOSIS — Z8673 Personal history of transient ischemic attack (TIA), and cerebral infarction without residual deficits: Secondary | ICD-10-CM | POA: Diagnosis not present

## 2020-12-17 DIAGNOSIS — I2510 Atherosclerotic heart disease of native coronary artery without angina pectoris: Secondary | ICD-10-CM | POA: Diagnosis not present

## 2020-12-17 DIAGNOSIS — E785 Hyperlipidemia, unspecified: Secondary | ICD-10-CM | POA: Diagnosis not present

## 2020-12-17 DIAGNOSIS — N183 Chronic kidney disease, stage 3 unspecified: Secondary | ICD-10-CM | POA: Diagnosis not present

## 2020-12-17 DIAGNOSIS — M6259 Muscle wasting and atrophy, not elsewhere classified, multiple sites: Secondary | ICD-10-CM | POA: Diagnosis not present

## 2020-12-17 DIAGNOSIS — R1312 Dysphagia, oropharyngeal phase: Secondary | ICD-10-CM | POA: Diagnosis not present

## 2020-12-17 DIAGNOSIS — M6281 Muscle weakness (generalized): Secondary | ICD-10-CM | POA: Diagnosis not present

## 2020-12-17 DIAGNOSIS — Q271 Congenital renal artery stenosis: Secondary | ICD-10-CM | POA: Diagnosis not present

## 2020-12-18 DIAGNOSIS — Z8673 Personal history of transient ischemic attack (TIA), and cerebral infarction without residual deficits: Secondary | ICD-10-CM | POA: Diagnosis not present

## 2020-12-18 DIAGNOSIS — R2689 Other abnormalities of gait and mobility: Secondary | ICD-10-CM | POA: Diagnosis not present

## 2020-12-18 DIAGNOSIS — Q271 Congenital renal artery stenosis: Secondary | ICD-10-CM | POA: Diagnosis not present

## 2020-12-18 DIAGNOSIS — N183 Chronic kidney disease, stage 3 unspecified: Secondary | ICD-10-CM | POA: Diagnosis not present

## 2020-12-18 DIAGNOSIS — I10 Essential (primary) hypertension: Secondary | ICD-10-CM | POA: Diagnosis not present

## 2020-12-18 DIAGNOSIS — E785 Hyperlipidemia, unspecified: Secondary | ICD-10-CM | POA: Diagnosis not present

## 2020-12-18 DIAGNOSIS — C3412 Malignant neoplasm of upper lobe, left bronchus or lung: Secondary | ICD-10-CM | POA: Diagnosis not present

## 2020-12-18 DIAGNOSIS — Z8679 Personal history of other diseases of the circulatory system: Secondary | ICD-10-CM | POA: Diagnosis not present

## 2020-12-18 DIAGNOSIS — R1312 Dysphagia, oropharyngeal phase: Secondary | ICD-10-CM | POA: Diagnosis not present

## 2020-12-18 DIAGNOSIS — M6259 Muscle wasting and atrophy, not elsewhere classified, multiple sites: Secondary | ICD-10-CM | POA: Diagnosis not present

## 2020-12-18 DIAGNOSIS — M6281 Muscle weakness (generalized): Secondary | ICD-10-CM | POA: Diagnosis not present

## 2020-12-18 DIAGNOSIS — I2510 Atherosclerotic heart disease of native coronary artery without angina pectoris: Secondary | ICD-10-CM | POA: Diagnosis not present

## 2020-12-18 DIAGNOSIS — R2681 Unsteadiness on feet: Secondary | ICD-10-CM | POA: Diagnosis not present

## 2020-12-19 DIAGNOSIS — R7989 Other specified abnormal findings of blood chemistry: Secondary | ICD-10-CM | POA: Diagnosis not present

## 2020-12-21 DIAGNOSIS — I10 Essential (primary) hypertension: Secondary | ICD-10-CM | POA: Diagnosis not present

## 2020-12-21 DIAGNOSIS — B351 Tinea unguium: Secondary | ICD-10-CM | POA: Diagnosis not present

## 2020-12-21 DIAGNOSIS — I2510 Atherosclerotic heart disease of native coronary artery without angina pectoris: Secondary | ICD-10-CM | POA: Diagnosis not present

## 2020-12-21 DIAGNOSIS — Q271 Congenital renal artery stenosis: Secondary | ICD-10-CM | POA: Diagnosis not present

## 2020-12-21 DIAGNOSIS — R2681 Unsteadiness on feet: Secondary | ICD-10-CM | POA: Diagnosis not present

## 2020-12-21 DIAGNOSIS — N183 Chronic kidney disease, stage 3 unspecified: Secondary | ICD-10-CM | POA: Diagnosis not present

## 2020-12-21 DIAGNOSIS — R1312 Dysphagia, oropharyngeal phase: Secondary | ICD-10-CM | POA: Diagnosis not present

## 2020-12-21 DIAGNOSIS — R2689 Other abnormalities of gait and mobility: Secondary | ICD-10-CM | POA: Diagnosis not present

## 2020-12-21 DIAGNOSIS — Z8679 Personal history of other diseases of the circulatory system: Secondary | ICD-10-CM | POA: Diagnosis not present

## 2020-12-21 DIAGNOSIS — I739 Peripheral vascular disease, unspecified: Secondary | ICD-10-CM | POA: Diagnosis not present

## 2020-12-21 DIAGNOSIS — M6259 Muscle wasting and atrophy, not elsewhere classified, multiple sites: Secondary | ICD-10-CM | POA: Diagnosis not present

## 2020-12-21 DIAGNOSIS — L603 Nail dystrophy: Secondary | ICD-10-CM | POA: Diagnosis not present

## 2020-12-21 DIAGNOSIS — M6281 Muscle weakness (generalized): Secondary | ICD-10-CM | POA: Diagnosis not present

## 2020-12-21 DIAGNOSIS — Z8673 Personal history of transient ischemic attack (TIA), and cerebral infarction without residual deficits: Secondary | ICD-10-CM | POA: Diagnosis not present

## 2020-12-21 DIAGNOSIS — E785 Hyperlipidemia, unspecified: Secondary | ICD-10-CM | POA: Diagnosis not present

## 2020-12-21 DIAGNOSIS — C3412 Malignant neoplasm of upper lobe, left bronchus or lung: Secondary | ICD-10-CM | POA: Diagnosis not present

## 2020-12-22 DIAGNOSIS — I10 Essential (primary) hypertension: Secondary | ICD-10-CM | POA: Diagnosis not present

## 2020-12-22 DIAGNOSIS — M6281 Muscle weakness (generalized): Secondary | ICD-10-CM | POA: Diagnosis not present

## 2020-12-22 DIAGNOSIS — R2689 Other abnormalities of gait and mobility: Secondary | ICD-10-CM | POA: Diagnosis not present

## 2020-12-22 DIAGNOSIS — E785 Hyperlipidemia, unspecified: Secondary | ICD-10-CM | POA: Diagnosis not present

## 2020-12-22 DIAGNOSIS — R7401 Elevation of levels of liver transaminase levels: Secondary | ICD-10-CM | POA: Diagnosis not present

## 2020-12-22 DIAGNOSIS — I2510 Atherosclerotic heart disease of native coronary artery without angina pectoris: Secondary | ICD-10-CM | POA: Diagnosis not present

## 2020-12-22 DIAGNOSIS — Q271 Congenital renal artery stenosis: Secondary | ICD-10-CM | POA: Diagnosis not present

## 2020-12-22 DIAGNOSIS — Z8673 Personal history of transient ischemic attack (TIA), and cerebral infarction without residual deficits: Secondary | ICD-10-CM | POA: Diagnosis not present

## 2020-12-22 DIAGNOSIS — D72829 Elevated white blood cell count, unspecified: Secondary | ICD-10-CM | POA: Diagnosis not present

## 2020-12-22 DIAGNOSIS — R1312 Dysphagia, oropharyngeal phase: Secondary | ICD-10-CM | POA: Diagnosis not present

## 2020-12-22 DIAGNOSIS — Z8679 Personal history of other diseases of the circulatory system: Secondary | ICD-10-CM | POA: Diagnosis not present

## 2020-12-22 DIAGNOSIS — R2681 Unsteadiness on feet: Secondary | ICD-10-CM | POA: Diagnosis not present

## 2020-12-22 DIAGNOSIS — N183 Chronic kidney disease, stage 3 unspecified: Secondary | ICD-10-CM | POA: Diagnosis not present

## 2020-12-22 DIAGNOSIS — C3412 Malignant neoplasm of upper lobe, left bronchus or lung: Secondary | ICD-10-CM | POA: Diagnosis not present

## 2020-12-22 DIAGNOSIS — M6259 Muscle wasting and atrophy, not elsewhere classified, multiple sites: Secondary | ICD-10-CM | POA: Diagnosis not present

## 2020-12-23 DIAGNOSIS — I2510 Atherosclerotic heart disease of native coronary artery without angina pectoris: Secondary | ICD-10-CM | POA: Diagnosis not present

## 2020-12-23 DIAGNOSIS — C3412 Malignant neoplasm of upper lobe, left bronchus or lung: Secondary | ICD-10-CM | POA: Diagnosis not present

## 2020-12-23 DIAGNOSIS — R1312 Dysphagia, oropharyngeal phase: Secondary | ICD-10-CM | POA: Diagnosis not present

## 2020-12-23 DIAGNOSIS — I10 Essential (primary) hypertension: Secondary | ICD-10-CM | POA: Diagnosis not present

## 2020-12-23 DIAGNOSIS — R109 Unspecified abdominal pain: Secondary | ICD-10-CM | POA: Diagnosis not present

## 2020-12-23 DIAGNOSIS — Q271 Congenital renal artery stenosis: Secondary | ICD-10-CM | POA: Diagnosis not present

## 2020-12-23 DIAGNOSIS — Z8679 Personal history of other diseases of the circulatory system: Secondary | ICD-10-CM | POA: Diagnosis not present

## 2020-12-23 DIAGNOSIS — R2681 Unsteadiness on feet: Secondary | ICD-10-CM | POA: Diagnosis not present

## 2020-12-23 DIAGNOSIS — N183 Chronic kidney disease, stage 3 unspecified: Secondary | ICD-10-CM | POA: Diagnosis not present

## 2020-12-23 DIAGNOSIS — E785 Hyperlipidemia, unspecified: Secondary | ICD-10-CM | POA: Diagnosis not present

## 2020-12-23 DIAGNOSIS — M6281 Muscle weakness (generalized): Secondary | ICD-10-CM | POA: Diagnosis not present

## 2020-12-23 DIAGNOSIS — Z8673 Personal history of transient ischemic attack (TIA), and cerebral infarction without residual deficits: Secondary | ICD-10-CM | POA: Diagnosis not present

## 2020-12-23 DIAGNOSIS — R2689 Other abnormalities of gait and mobility: Secondary | ICD-10-CM | POA: Diagnosis not present

## 2020-12-23 DIAGNOSIS — R945 Abnormal results of liver function studies: Secondary | ICD-10-CM | POA: Diagnosis not present

## 2020-12-23 DIAGNOSIS — M6259 Muscle wasting and atrophy, not elsewhere classified, multiple sites: Secondary | ICD-10-CM | POA: Diagnosis not present

## 2020-12-23 DIAGNOSIS — R062 Wheezing: Secondary | ICD-10-CM | POA: Diagnosis not present

## 2020-12-24 DIAGNOSIS — I2510 Atherosclerotic heart disease of native coronary artery without angina pectoris: Secondary | ICD-10-CM | POA: Diagnosis not present

## 2020-12-24 DIAGNOSIS — Z7189 Other specified counseling: Secondary | ICD-10-CM | POA: Diagnosis not present

## 2020-12-24 DIAGNOSIS — I714 Abdominal aortic aneurysm, without rupture: Secondary | ICD-10-CM | POA: Diagnosis not present

## 2020-12-24 DIAGNOSIS — R2681 Unsteadiness on feet: Secondary | ICD-10-CM | POA: Diagnosis not present

## 2020-12-24 DIAGNOSIS — Z8673 Personal history of transient ischemic attack (TIA), and cerebral infarction without residual deficits: Secondary | ICD-10-CM | POA: Diagnosis not present

## 2020-12-24 DIAGNOSIS — K828 Other specified diseases of gallbladder: Secondary | ICD-10-CM | POA: Diagnosis not present

## 2020-12-24 DIAGNOSIS — D72829 Elevated white blood cell count, unspecified: Secondary | ICD-10-CM | POA: Diagnosis not present

## 2020-12-24 DIAGNOSIS — M6281 Muscle weakness (generalized): Secondary | ICD-10-CM | POA: Diagnosis not present

## 2020-12-24 DIAGNOSIS — I10 Essential (primary) hypertension: Secondary | ICD-10-CM | POA: Diagnosis not present

## 2020-12-24 DIAGNOSIS — E785 Hyperlipidemia, unspecified: Secondary | ICD-10-CM | POA: Diagnosis not present

## 2020-12-24 DIAGNOSIS — C3412 Malignant neoplasm of upper lobe, left bronchus or lung: Secondary | ICD-10-CM | POA: Diagnosis not present

## 2020-12-24 DIAGNOSIS — R1312 Dysphagia, oropharyngeal phase: Secondary | ICD-10-CM | POA: Diagnosis not present

## 2020-12-24 DIAGNOSIS — R2689 Other abnormalities of gait and mobility: Secondary | ICD-10-CM | POA: Diagnosis not present

## 2020-12-24 DIAGNOSIS — Q271 Congenital renal artery stenosis: Secondary | ICD-10-CM | POA: Diagnosis not present

## 2020-12-24 DIAGNOSIS — Z8679 Personal history of other diseases of the circulatory system: Secondary | ICD-10-CM | POA: Diagnosis not present

## 2020-12-24 DIAGNOSIS — N183 Chronic kidney disease, stage 3 unspecified: Secondary | ICD-10-CM | POA: Diagnosis not present

## 2020-12-24 DIAGNOSIS — M6259 Muscle wasting and atrophy, not elsewhere classified, multiple sites: Secondary | ICD-10-CM | POA: Diagnosis not present

## 2020-12-25 DIAGNOSIS — R300 Dysuria: Secondary | ICD-10-CM | POA: Diagnosis not present

## 2020-12-25 DIAGNOSIS — Z8673 Personal history of transient ischemic attack (TIA), and cerebral infarction without residual deficits: Secondary | ICD-10-CM | POA: Diagnosis not present

## 2020-12-25 DIAGNOSIS — R2681 Unsteadiness on feet: Secondary | ICD-10-CM | POA: Diagnosis not present

## 2020-12-25 DIAGNOSIS — M6259 Muscle wasting and atrophy, not elsewhere classified, multiple sites: Secondary | ICD-10-CM | POA: Diagnosis not present

## 2020-12-25 DIAGNOSIS — M6281 Muscle weakness (generalized): Secondary | ICD-10-CM | POA: Diagnosis not present

## 2020-12-25 DIAGNOSIS — Q271 Congenital renal artery stenosis: Secondary | ICD-10-CM | POA: Diagnosis not present

## 2020-12-25 DIAGNOSIS — E785 Hyperlipidemia, unspecified: Secondary | ICD-10-CM | POA: Diagnosis not present

## 2020-12-25 DIAGNOSIS — R1312 Dysphagia, oropharyngeal phase: Secondary | ICD-10-CM | POA: Diagnosis not present

## 2020-12-25 DIAGNOSIS — Z8679 Personal history of other diseases of the circulatory system: Secondary | ICD-10-CM | POA: Diagnosis not present

## 2020-12-25 DIAGNOSIS — R918 Other nonspecific abnormal finding of lung field: Secondary | ICD-10-CM | POA: Diagnosis not present

## 2020-12-25 DIAGNOSIS — C3412 Malignant neoplasm of upper lobe, left bronchus or lung: Secondary | ICD-10-CM | POA: Diagnosis not present

## 2020-12-25 DIAGNOSIS — I2510 Atherosclerotic heart disease of native coronary artery without angina pectoris: Secondary | ICD-10-CM | POA: Diagnosis not present

## 2020-12-25 DIAGNOSIS — N183 Chronic kidney disease, stage 3 unspecified: Secondary | ICD-10-CM | POA: Diagnosis not present

## 2020-12-25 DIAGNOSIS — D72829 Elevated white blood cell count, unspecified: Secondary | ICD-10-CM | POA: Diagnosis not present

## 2020-12-25 DIAGNOSIS — R2689 Other abnormalities of gait and mobility: Secondary | ICD-10-CM | POA: Diagnosis not present

## 2020-12-25 DIAGNOSIS — R410 Disorientation, unspecified: Secondary | ICD-10-CM | POA: Diagnosis not present

## 2020-12-25 DIAGNOSIS — I10 Essential (primary) hypertension: Secondary | ICD-10-CM | POA: Diagnosis not present

## 2020-12-26 DIAGNOSIS — N390 Urinary tract infection, site not specified: Secondary | ICD-10-CM | POA: Diagnosis not present

## 2020-12-28 DIAGNOSIS — I679 Cerebrovascular disease, unspecified: Secondary | ICD-10-CM | POA: Diagnosis not present

## 2020-12-28 DIAGNOSIS — Z8673 Personal history of transient ischemic attack (TIA), and cerebral infarction without residual deficits: Secondary | ICD-10-CM | POA: Diagnosis not present

## 2020-12-28 DIAGNOSIS — D72829 Elevated white blood cell count, unspecified: Secondary | ICD-10-CM | POA: Diagnosis not present

## 2020-12-28 DIAGNOSIS — C3490 Malignant neoplasm of unspecified part of unspecified bronchus or lung: Secondary | ICD-10-CM | POA: Diagnosis not present

## 2020-12-28 DIAGNOSIS — R2681 Unsteadiness on feet: Secondary | ICD-10-CM | POA: Diagnosis not present

## 2020-12-28 DIAGNOSIS — E785 Hyperlipidemia, unspecified: Secondary | ICD-10-CM | POA: Diagnosis not present

## 2020-12-28 DIAGNOSIS — C3412 Malignant neoplasm of upper lobe, left bronchus or lung: Secondary | ICD-10-CM | POA: Diagnosis not present

## 2020-12-28 DIAGNOSIS — Q271 Congenital renal artery stenosis: Secondary | ICD-10-CM | POA: Diagnosis not present

## 2020-12-28 DIAGNOSIS — I10 Essential (primary) hypertension: Secondary | ICD-10-CM | POA: Diagnosis not present

## 2020-12-28 DIAGNOSIS — R2689 Other abnormalities of gait and mobility: Secondary | ICD-10-CM | POA: Diagnosis not present

## 2020-12-28 DIAGNOSIS — Z8679 Personal history of other diseases of the circulatory system: Secondary | ICD-10-CM | POA: Diagnosis not present

## 2020-12-28 DIAGNOSIS — N183 Chronic kidney disease, stage 3 unspecified: Secondary | ICD-10-CM | POA: Diagnosis not present

## 2020-12-28 DIAGNOSIS — M6281 Muscle weakness (generalized): Secondary | ICD-10-CM | POA: Diagnosis not present

## 2020-12-28 DIAGNOSIS — R1312 Dysphagia, oropharyngeal phase: Secondary | ICD-10-CM | POA: Diagnosis not present

## 2020-12-28 DIAGNOSIS — I2510 Atherosclerotic heart disease of native coronary artery without angina pectoris: Secondary | ICD-10-CM | POA: Diagnosis not present

## 2020-12-28 DIAGNOSIS — M6259 Muscle wasting and atrophy, not elsewhere classified, multiple sites: Secondary | ICD-10-CM | POA: Diagnosis not present

## 2020-12-29 DIAGNOSIS — E785 Hyperlipidemia, unspecified: Secondary | ICD-10-CM | POA: Diagnosis not present

## 2020-12-29 DIAGNOSIS — I2510 Atherosclerotic heart disease of native coronary artery without angina pectoris: Secondary | ICD-10-CM | POA: Diagnosis not present

## 2020-12-29 DIAGNOSIS — Z8673 Personal history of transient ischemic attack (TIA), and cerebral infarction without residual deficits: Secondary | ICD-10-CM | POA: Diagnosis not present

## 2020-12-29 DIAGNOSIS — M6259 Muscle wasting and atrophy, not elsewhere classified, multiple sites: Secondary | ICD-10-CM | POA: Diagnosis not present

## 2020-12-29 DIAGNOSIS — Q271 Congenital renal artery stenosis: Secondary | ICD-10-CM | POA: Diagnosis not present

## 2020-12-29 DIAGNOSIS — N183 Chronic kidney disease, stage 3 unspecified: Secondary | ICD-10-CM | POA: Diagnosis not present

## 2020-12-29 DIAGNOSIS — M6281 Muscle weakness (generalized): Secondary | ICD-10-CM | POA: Diagnosis not present

## 2020-12-29 DIAGNOSIS — R2689 Other abnormalities of gait and mobility: Secondary | ICD-10-CM | POA: Diagnosis not present

## 2020-12-29 DIAGNOSIS — R1312 Dysphagia, oropharyngeal phase: Secondary | ICD-10-CM | POA: Diagnosis not present

## 2020-12-29 DIAGNOSIS — I10 Essential (primary) hypertension: Secondary | ICD-10-CM | POA: Diagnosis not present

## 2020-12-29 DIAGNOSIS — Z8679 Personal history of other diseases of the circulatory system: Secondary | ICD-10-CM | POA: Diagnosis not present

## 2020-12-29 DIAGNOSIS — C3412 Malignant neoplasm of upper lobe, left bronchus or lung: Secondary | ICD-10-CM | POA: Diagnosis not present

## 2020-12-29 DIAGNOSIS — R2681 Unsteadiness on feet: Secondary | ICD-10-CM | POA: Diagnosis not present

## 2020-12-30 DIAGNOSIS — C3412 Malignant neoplasm of upper lobe, left bronchus or lung: Secondary | ICD-10-CM | POA: Diagnosis not present

## 2020-12-30 DIAGNOSIS — I10 Essential (primary) hypertension: Secondary | ICD-10-CM | POA: Diagnosis not present

## 2020-12-30 DIAGNOSIS — M6281 Muscle weakness (generalized): Secondary | ICD-10-CM | POA: Diagnosis not present

## 2020-12-30 DIAGNOSIS — M6259 Muscle wasting and atrophy, not elsewhere classified, multiple sites: Secondary | ICD-10-CM | POA: Diagnosis not present

## 2020-12-30 DIAGNOSIS — R2681 Unsteadiness on feet: Secondary | ICD-10-CM | POA: Diagnosis not present

## 2020-12-30 DIAGNOSIS — I2510 Atherosclerotic heart disease of native coronary artery without angina pectoris: Secondary | ICD-10-CM | POA: Diagnosis not present

## 2020-12-30 DIAGNOSIS — N183 Chronic kidney disease, stage 3 unspecified: Secondary | ICD-10-CM | POA: Diagnosis not present

## 2020-12-30 DIAGNOSIS — Q271 Congenital renal artery stenosis: Secondary | ICD-10-CM | POA: Diagnosis not present

## 2020-12-30 DIAGNOSIS — Z8673 Personal history of transient ischemic attack (TIA), and cerebral infarction without residual deficits: Secondary | ICD-10-CM | POA: Diagnosis not present

## 2020-12-30 DIAGNOSIS — R1312 Dysphagia, oropharyngeal phase: Secondary | ICD-10-CM | POA: Diagnosis not present

## 2020-12-30 DIAGNOSIS — R2689 Other abnormalities of gait and mobility: Secondary | ICD-10-CM | POA: Diagnosis not present

## 2020-12-30 DIAGNOSIS — E785 Hyperlipidemia, unspecified: Secondary | ICD-10-CM | POA: Diagnosis not present

## 2020-12-30 DIAGNOSIS — Z8679 Personal history of other diseases of the circulatory system: Secondary | ICD-10-CM | POA: Diagnosis not present

## 2020-12-31 DIAGNOSIS — M6259 Muscle wasting and atrophy, not elsewhere classified, multiple sites: Secondary | ICD-10-CM | POA: Diagnosis not present

## 2020-12-31 DIAGNOSIS — Z8673 Personal history of transient ischemic attack (TIA), and cerebral infarction without residual deficits: Secondary | ICD-10-CM | POA: Diagnosis not present

## 2020-12-31 DIAGNOSIS — R2681 Unsteadiness on feet: Secondary | ICD-10-CM | POA: Diagnosis not present

## 2020-12-31 DIAGNOSIS — Q271 Congenital renal artery stenosis: Secondary | ICD-10-CM | POA: Diagnosis not present

## 2020-12-31 DIAGNOSIS — N183 Chronic kidney disease, stage 3 unspecified: Secondary | ICD-10-CM | POA: Diagnosis not present

## 2020-12-31 DIAGNOSIS — I2510 Atherosclerotic heart disease of native coronary artery without angina pectoris: Secondary | ICD-10-CM | POA: Diagnosis not present

## 2020-12-31 DIAGNOSIS — E785 Hyperlipidemia, unspecified: Secondary | ICD-10-CM | POA: Diagnosis not present

## 2020-12-31 DIAGNOSIS — R1312 Dysphagia, oropharyngeal phase: Secondary | ICD-10-CM | POA: Diagnosis not present

## 2020-12-31 DIAGNOSIS — C3412 Malignant neoplasm of upper lobe, left bronchus or lung: Secondary | ICD-10-CM | POA: Diagnosis not present

## 2020-12-31 DIAGNOSIS — I10 Essential (primary) hypertension: Secondary | ICD-10-CM | POA: Diagnosis not present

## 2020-12-31 DIAGNOSIS — Z8679 Personal history of other diseases of the circulatory system: Secondary | ICD-10-CM | POA: Diagnosis not present

## 2020-12-31 DIAGNOSIS — M6281 Muscle weakness (generalized): Secondary | ICD-10-CM | POA: Diagnosis not present

## 2020-12-31 DIAGNOSIS — R2689 Other abnormalities of gait and mobility: Secondary | ICD-10-CM | POA: Diagnosis not present

## 2021-01-01 DIAGNOSIS — Z8679 Personal history of other diseases of the circulatory system: Secondary | ICD-10-CM | POA: Diagnosis not present

## 2021-01-01 DIAGNOSIS — C3412 Malignant neoplasm of upper lobe, left bronchus or lung: Secondary | ICD-10-CM | POA: Diagnosis not present

## 2021-01-01 DIAGNOSIS — R1312 Dysphagia, oropharyngeal phase: Secondary | ICD-10-CM | POA: Diagnosis not present

## 2021-01-01 DIAGNOSIS — E785 Hyperlipidemia, unspecified: Secondary | ICD-10-CM | POA: Diagnosis not present

## 2021-01-01 DIAGNOSIS — M6281 Muscle weakness (generalized): Secondary | ICD-10-CM | POA: Diagnosis not present

## 2021-01-01 DIAGNOSIS — Q271 Congenital renal artery stenosis: Secondary | ICD-10-CM | POA: Diagnosis not present

## 2021-01-01 DIAGNOSIS — I2510 Atherosclerotic heart disease of native coronary artery without angina pectoris: Secondary | ICD-10-CM | POA: Diagnosis not present

## 2021-01-01 DIAGNOSIS — Z8673 Personal history of transient ischemic attack (TIA), and cerebral infarction without residual deficits: Secondary | ICD-10-CM | POA: Diagnosis not present

## 2021-01-01 DIAGNOSIS — R2681 Unsteadiness on feet: Secondary | ICD-10-CM | POA: Diagnosis not present

## 2021-01-01 DIAGNOSIS — N183 Chronic kidney disease, stage 3 unspecified: Secondary | ICD-10-CM | POA: Diagnosis not present

## 2021-01-01 DIAGNOSIS — R2689 Other abnormalities of gait and mobility: Secondary | ICD-10-CM | POA: Diagnosis not present

## 2021-01-01 DIAGNOSIS — I10 Essential (primary) hypertension: Secondary | ICD-10-CM | POA: Diagnosis not present

## 2021-01-01 DIAGNOSIS — M6259 Muscle wasting and atrophy, not elsewhere classified, multiple sites: Secondary | ICD-10-CM | POA: Diagnosis not present

## 2021-01-14 DIAGNOSIS — R296 Repeated falls: Secondary | ICD-10-CM | POA: Diagnosis not present

## 2021-01-14 DIAGNOSIS — I679 Cerebrovascular disease, unspecified: Secondary | ICD-10-CM | POA: Diagnosis not present

## 2021-01-14 DIAGNOSIS — R918 Other nonspecific abnormal finding of lung field: Secondary | ICD-10-CM | POA: Diagnosis not present

## 2021-01-14 DIAGNOSIS — I10 Essential (primary) hypertension: Secondary | ICD-10-CM | POA: Diagnosis not present

## 2021-01-18 DIAGNOSIS — W19XXXA Unspecified fall, initial encounter: Secondary | ICD-10-CM | POA: Diagnosis not present

## 2021-01-18 DIAGNOSIS — Z515 Encounter for palliative care: Secondary | ICD-10-CM | POA: Diagnosis not present

## 2021-01-18 DIAGNOSIS — G9340 Encephalopathy, unspecified: Secondary | ICD-10-CM | POA: Diagnosis not present

## 2021-01-18 DIAGNOSIS — C3490 Malignant neoplasm of unspecified part of unspecified bronchus or lung: Secondary | ICD-10-CM | POA: Diagnosis not present

## 2021-02-20 DEATH — deceased

## 2021-06-18 IMAGING — CT CT HEAD W/O CM
3 series · 15 of 47 positions shown, 18 images · non-contrast
Comparison: None.

CLINICAL DATA: Facial trauma. Altered mental status. Blood noted to
nose.

EXAM:
CT HEAD WITHOUT CONTRAST
TECHNIQUE: Contiguous axial images were obtained from the base of the skull
through the vertex without intravenous contrast.

[Series 2: head w o · axial · 0.45mm/px · z∈[+1422,+1547]mm · 9 of 30 slices shown, 12 images]
[im 3/30  brain]
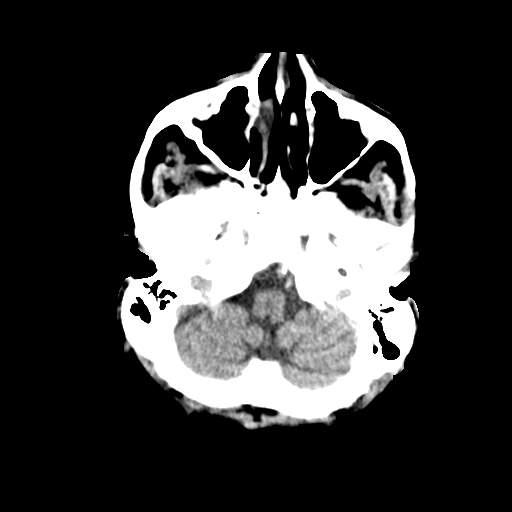
[im 3/30  bone]
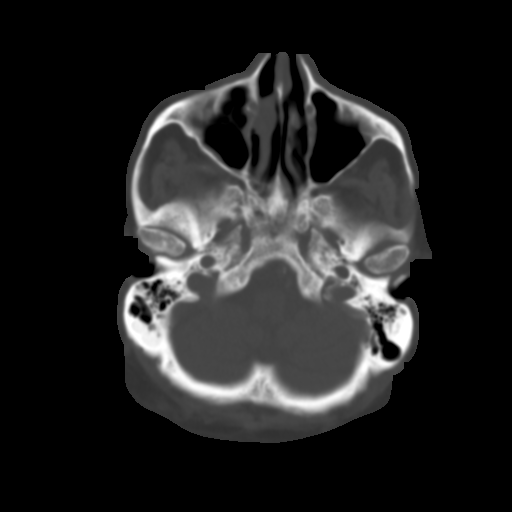
[im 6/30  brain]
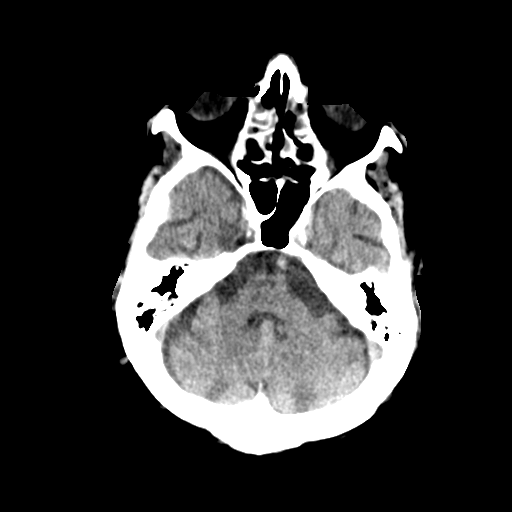
[im 9/30  brain]
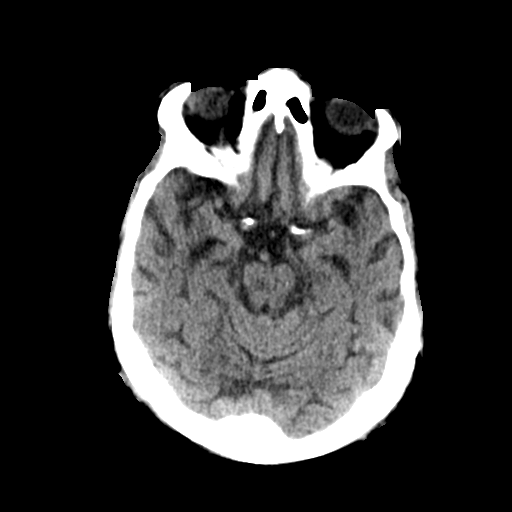
[im 12/30  brain]
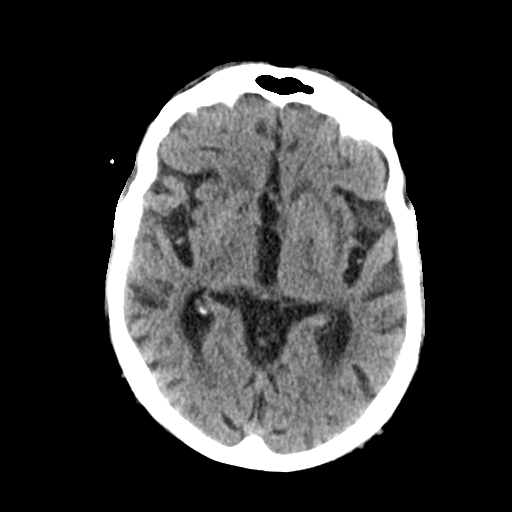
[im 16/30  brain]
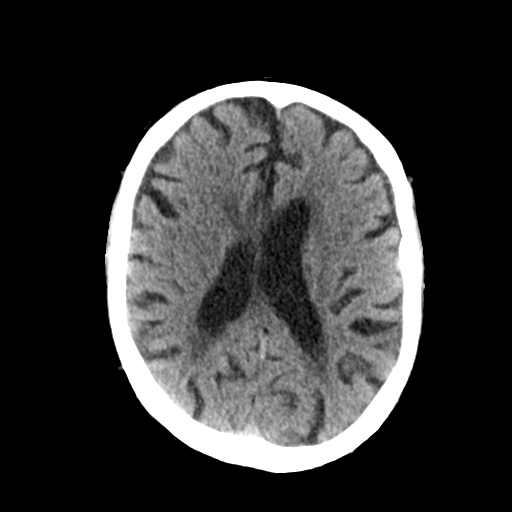
[im 16/30  bone]
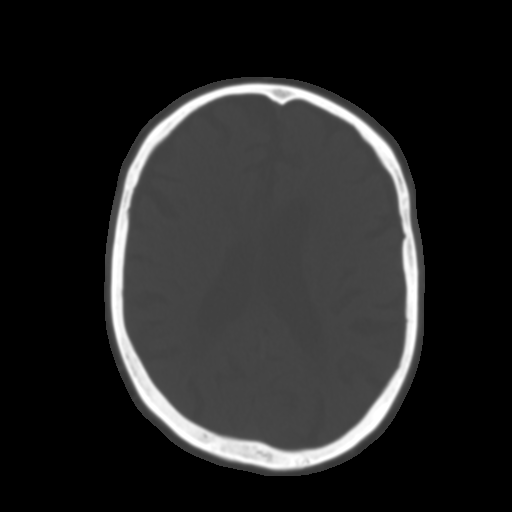
[im 19/30  brain]
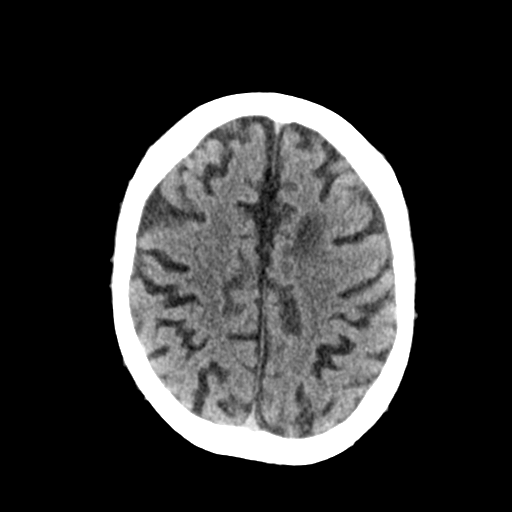
[im 22/30  brain]
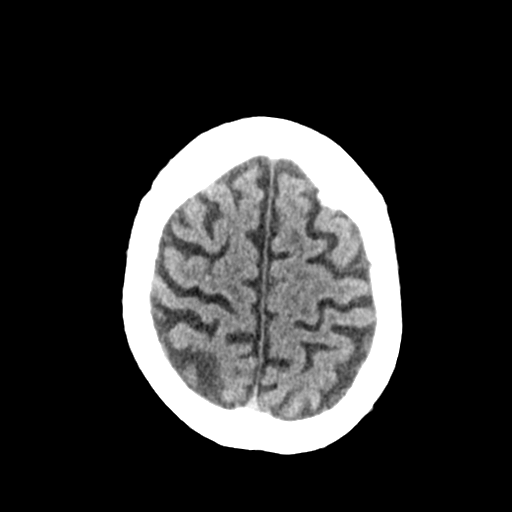
[im 25/30  brain]
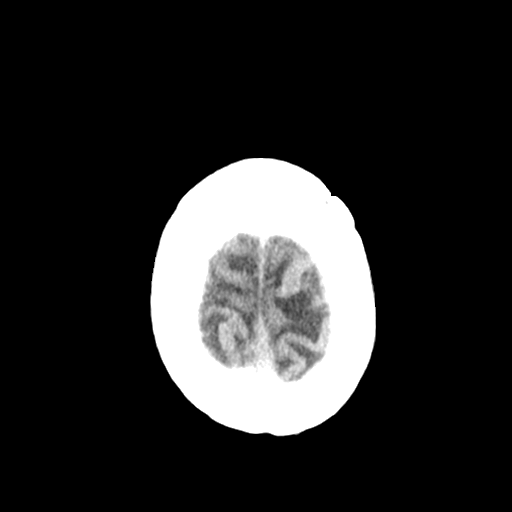
[im 28/30  brain]
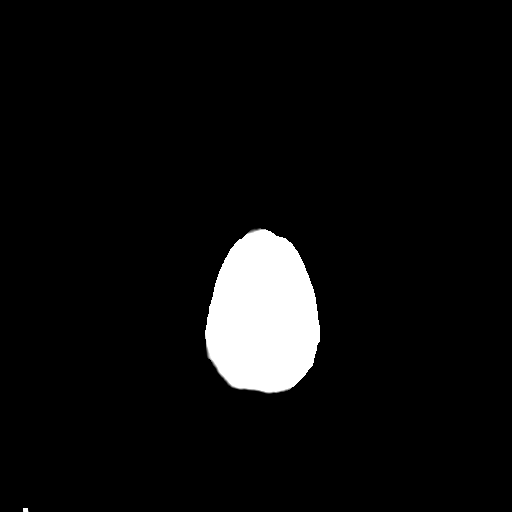
[im 28/30  bone]
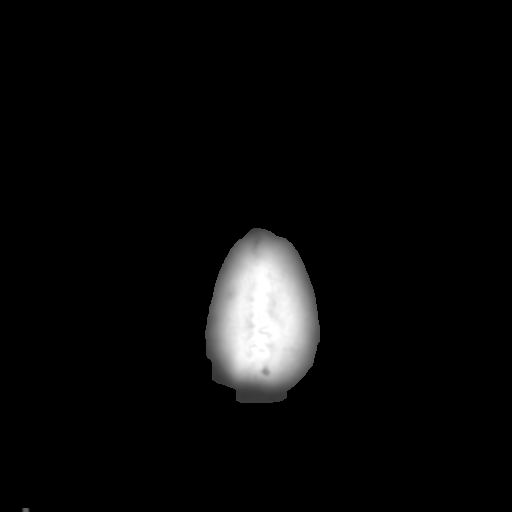

[Series 4: coronal soft · coronal · 0.30mm/px · 3 of 70 slices shown]
[im 24/70  brain]
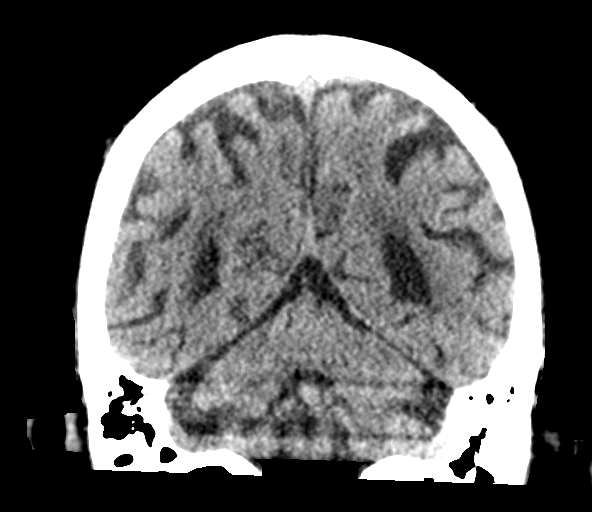
[im 31/70  brain]
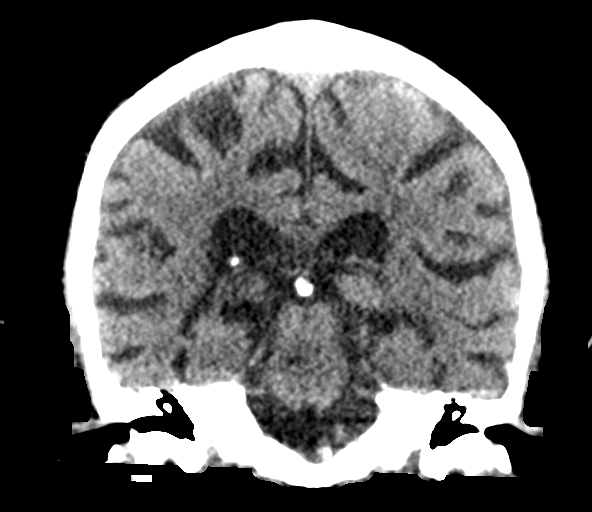
[im 39/70  brain]
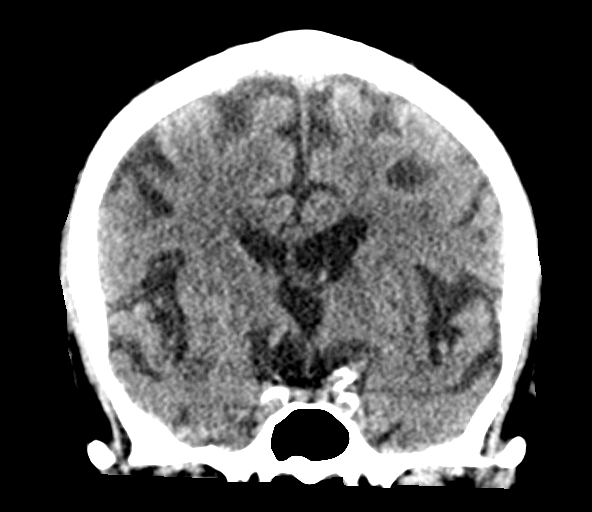

[Series 5: sagittal soft · sagittal · 0.33mm/px · 3 of 59 slices shown]
[im 20/59  brain]
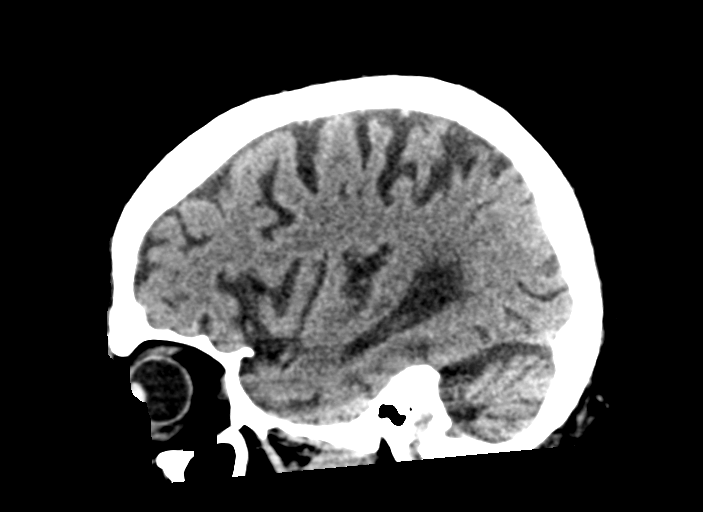
[im 30/59  brain]
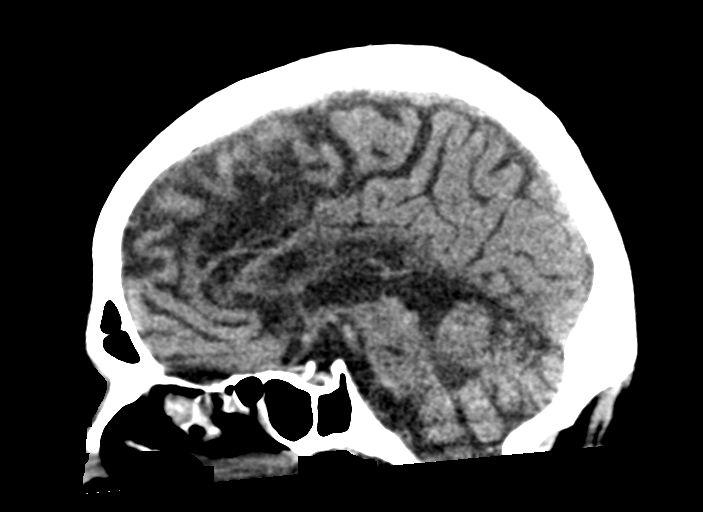
[im 39/59  brain]
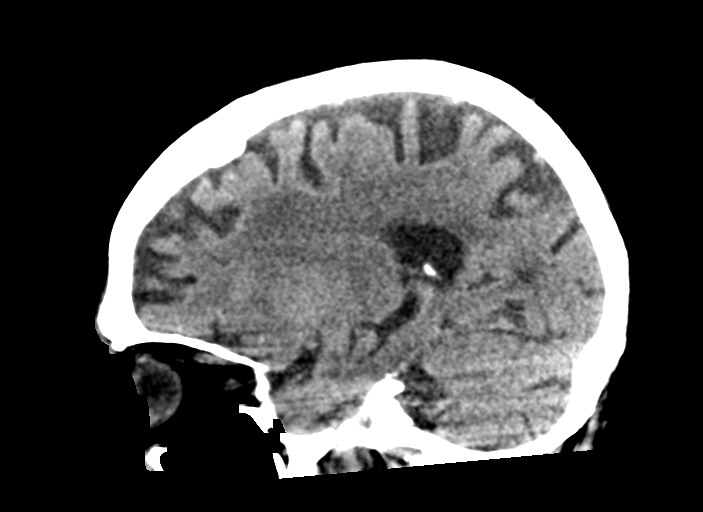

[15 of 47 positions shown; findings below may reference images not displayed]

FINDINGS: Brain: No hemorrhage or evidence of acute ischemia. Generalized
atrophy with moderate chronic small vessel ischemia.
Encephalomalacia in the high left frontal lobe consistent with
remote infarct. Remote lacunar infarct in the left caudate and basal
ganglia. Slight asymmetry of the lateral ventricles with left larger
than right, possible ex vacuo dilatation. No hydrocephalus, the
basilar cisterns are patent. There is no subdural or extra-axial
collection. No concerning intraabdominal mass effect.

Vascular: Vascular calcifications at the skull base without
hyperdense vessel.

Skull: Probable remote left frontal burr hole. No skull fracture or
focal lesion.

Sinuses/Orbits: Scattered opacification of lower mastoid air cells.
Detailed evaluation on concurrent face CT, reported separately.

Other: None.
IMPRESSION: 1. No acute intracranial abnormality. No skull fracture.
2. Remote left frontal infarct. Remote lacunar infarcts in the left
caudate and basal ganglia.

## 2021-06-18 IMAGING — DX DG CHEST 1V PORT
1 series · 1 of 1 positions shown · non-contrast
Comparison: Remote radiograph 05/07/2008

CLINICAL DATA: Shortness of breath.  Altered mental status.

EXAM:
PORTABLE CHEST 1 VIEW

[chest ap]
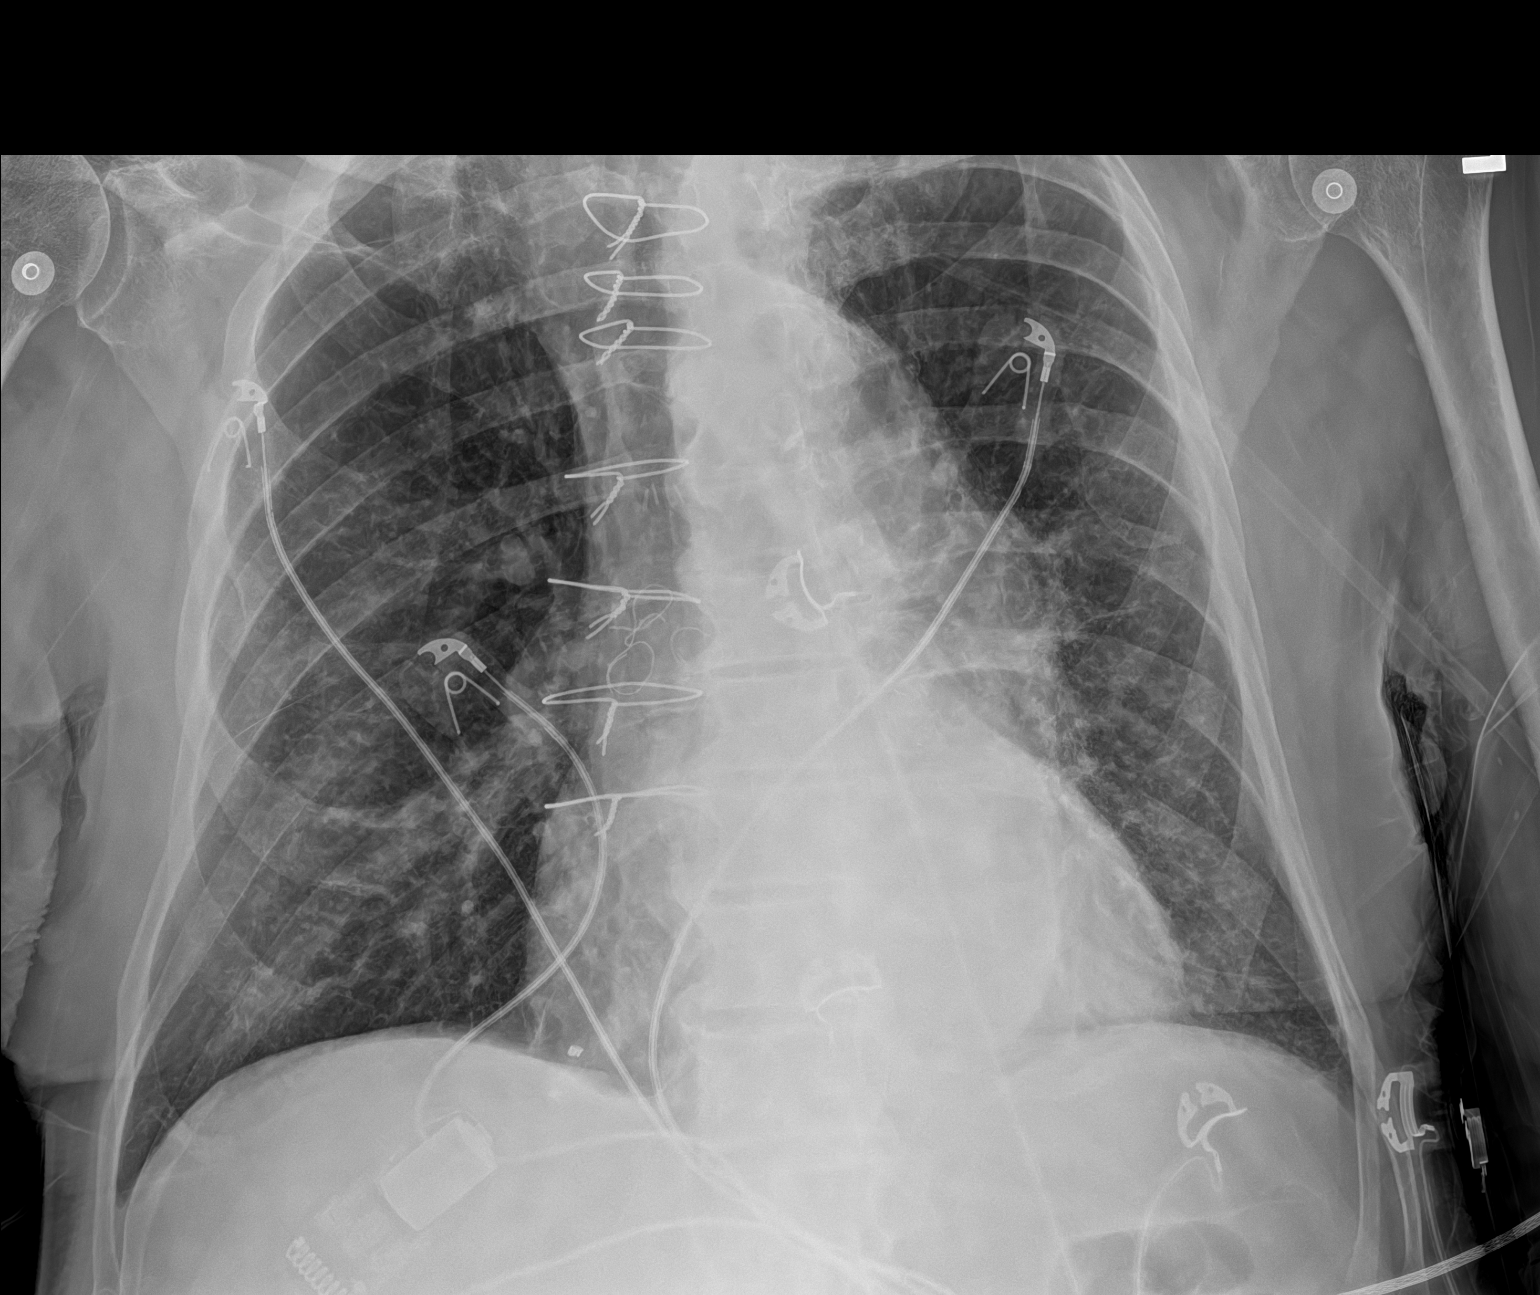

[1 of 1 positions shown; findings below may reference images not displayed]

FINDINGS: Patient is rotated. Post median sternotomy. Upper normal heart size.
Tortuous aorta likely accentuated by rotation. Aortic
atherosclerosis. Minimal subpleural reticulation at the left lung
base. Mild biapical pleuroparenchymal scarring. No confluent
consolidation. No pneumothorax or large pleural effusion. No acute
osseous abnormalities are seen.
IMPRESSION: 1. Minimal subpleural reticulation at the left lung base, favor
atelectasis or scarring.
2. Upper normal heart size. Aortic tortuosity likely accentuated by
rotation.

Aortic Atherosclerosis ([id number]).
